# Patient Record
Sex: Male | Born: 1950 | Race: White | Hispanic: No | Smoking: Never smoker
Health system: Southern US, Community
[De-identification: ages and names within clinical notes are randomized; demographics above are authoritative.]

## PROBLEM LIST (undated history)

## (undated) DIAGNOSIS — G629 Polyneuropathy, unspecified: Secondary | ICD-10-CM

## (undated) DIAGNOSIS — I10 Essential (primary) hypertension: Secondary | ICD-10-CM

## (undated) DIAGNOSIS — R7303 Prediabetes: Secondary | ICD-10-CM

## (undated) DIAGNOSIS — I639 Cerebral infarction, unspecified: Secondary | ICD-10-CM

## (undated) DIAGNOSIS — Z8739 Personal history of other diseases of the musculoskeletal system and connective tissue: Secondary | ICD-10-CM

## (undated) DIAGNOSIS — M1990 Unspecified osteoarthritis, unspecified site: Secondary | ICD-10-CM

## (undated) DIAGNOSIS — N189 Chronic kidney disease, unspecified: Secondary | ICD-10-CM

## (undated) DIAGNOSIS — Z87442 Personal history of urinary calculi: Secondary | ICD-10-CM

## (undated) HISTORY — PX: HEMORRHOID SURGERY: SHX153

## (undated) HISTORY — PX: CARPAL TUNNEL RELEASE: SHX101

## (undated) HISTORY — PX: LITHOTRIPSY: SUR834

## (undated) HISTORY — PX: HERNIA REPAIR: SHX51

## (undated) HISTORY — PX: BACK SURGERY: SHX140

---

## 2015-10-10 DIAGNOSIS — I639 Cerebral infarction, unspecified: Secondary | ICD-10-CM

## 2015-10-10 HISTORY — DX: Cerebral infarction, unspecified: I63.9

## 2017-11-14 ENCOUNTER — Other Ambulatory Visit: Payer: Self-pay | Admitting: Podiatry

## 2017-11-19 NOTE — Patient Instructions (Signed)
James Gillespie  11/19/2017     @PREFPERIOPPHARMACY @   Your procedure is scheduled on  11/21/2017   Report to [facility] at  645   A.M.  Call this number if you have problems the morning of surgery:  [phone]   Remember:  Do not eat food or drink liquids after midnight.  Take these medicines the morning of surgery with A SIP OF WATER  Neurontin, lisinopril.   Do not wear jewelry, make-up or nail polish.  Do not wear lotions, powders, or perfumes, or deodorant.  Do not shave 48 hours prior to surgery.  Men may shave face and neck.  Do not bring valuables to the hospital.  [facility] is not responsible for any belongings or valuables.  Contacts, dentures or bridgework may not be worn into surgery.  Leave your suitcase in the car.  After surgery it may be brought to your room.  For patients admitted to the hospital, discharge time will be determined by your treatment team.  Patients discharged the day of surgery will not be allowed to drive home.   Name and phone number of your driver:   family Special instructions:  None  Please read over the following fact sheets that you were given. Anesthesia Post-op Instructions and Care and Recovery After Surgery      Surgical Wound Debridement Surgical wound debridement is a procedure that removes dead or infected tissue and other substances from a wound. To heal, a wound must be clean. It also must have an adequate blood supply. Anything that prevents this must be taken out of the wound. This may include:  Dead tissue.  Scar tissue.  Fluid that has built up.  Debris from outside of the body.  You may need this procedure if you have a wound that has not healed (chronic wound). Tell a health care provider about:  Any allergies you have.  All medicines you are taking, including vitamins, herbs, eyedrops, creams, and over-the-counter medicines.  Any problems you or family members have had with  anesthetic medicines.  Any blood disorders you have.  Any surgeries you have had.  Any medical conditions you have.  Whether you are pregnant or may be pregnant. What are the risks? Generally, this is a safe procedure. However, problems may occur, including:  Bleeding.  Infection.  Damage to nerves, blood vessels, or healthy tissue inside the wound.  Scarring and loss of function.  What happens before the procedure?  Ask your health care provider about: ? Changing or stopping your regular medicines. This is especially important if you are taking diabetes medicines or blood thinners. ? Taking medicines such as aspirin and ibuprofen. These medicines can thin your blood. Do not take these medicines before your procedure if your health care provider instructs you not to.  Follow instructions from your health care provider about eating or drinking restrictions.  You may be given antibiotic medicine to help prevent or treat infection.  You may have blood tests.  Plan to have someone take you home after the procedure. What happens during the procedure?  To reduce your risk of infection: ? Your health care team will wash or sanitize their hands. ? Your skin will be washed with soap.  You may have small monitors placed on your body. These are used to check your heart, blood pressure, and oxygen level.  An IV will be put in your hand or  arm.  You will be given one or more of the following: ? A medicine to help you relax (sedative). ? A medicine to numb the area (local anesthetic). ? A medicine to make you fall asleep (general anesthetic). ? A medicine that is injected into your spine to numb the area below and slightly above the injection site (spinal anesthetic). ? A medicine that is injected into an area of your body to numb everything below the injection site (regional anesthetic).  Your health care provider will clean your wound with a sterile salt-water (saline)  solution.  Your health care provider will use scissors, surgical knives (scalpels) and surgical tweezers (forceps), or a laser to remove dead tissue. Your health care provider will also remove any other material that should not be in the wound.  After the tissue and other materials have been removed from the wound, your health care provider will clean the wound again and apply a bandage (dressing). The procedure may vary among health care providers and hospitals. What happens after the procedure?  Your blood pressure, heart rate, breathing rate, and blood oxygen level will be monitored often until the medicines you were given have worn off.  You will be given medicine for pain.  You will continue to receive antibiotic medicine if it was started before your procedure. This information is not intended to replace advice given to you by your health care provider. Make sure you discuss any questions you have with your health care provider. Document Released: 12/20/2009 Document Revised: 03/02/2016 Document Reviewed: 02/03/2015 Elsevier Interactive Patient Education  2018 [place].  Surgical Wound Debridement, Care After Refer to this sheet in the next few weeks. These instructions provide you with information about caring for yourself after your procedure. Your health care provider may also give you more specific instructions. Your treatment has been planned according to current medical practices, but problems sometimes occur. Call your health care provider if you have any problems or questions after your procedure. What can I expect after the procedure? After the procedure, it is common to have:  Pain or soreness.  Fluid that leaks from the wound.  Stiffness.  Follow these instructions at home: Medicines  Take over-the-counter and prescription medicines only as told by your health care provider.  If you were prescribed an antibiotic medicine, take it or apply it as told by your  health care provider. Do not stop taking or using the antibiotic even if your condition improves. Wound care  Follow instructions from your health care provider about: ? How to take care of your wound. ? When and how you should change your dressing. ? When you should remove your dressing. If your dressing is dry and stuck when you try to remove it, moisten or wet the dressing with saline or water so that it can be removed without harming your skin or wound tissue.  Check your wound every day for signs of infection. Have a caregiver do this for you if you are not able. Watch for: ? More redness, swelling, or pain. ? More fluid, blood, or pus. ? A bad smell. Activity   Do not drive or operate heavy machinery while taking prescription pain medicine.  Ask your health care provider what activities are safe for you. General instructions  Eat a healthy diet with lots of protein. Ask your health care provider to suggest the best diet for you.  Do not smoke. Smoking makes it harder for your body to heal.  Keep all  follow-up visits as told by your health care provider. This is important.  Do not take baths, swim, or use a hot tub until your health care provider approves. Contact a health care provider if:  You have a fever.  Your pain medicine is not helping.  Your wound is red and swollen.  You have increased bleeding.  You have pus coming from your wound.  You have a bad smell coming from your wound.  Your wound is not getting better after 1-2 weeks of treatment.  You develop a new medical condition, such as diabetes, peripheral vascular disease, or conditions that affect your defense (immune) system. This information is not intended to replace advice given to you by your health care provider. Make sure you discuss any questions you have with your health care provider. Document Released: 09/11/2012 Document Revised: 03/01/2016 Document Reviewed: 02/03/2015 Elsevier Interactive  Patient Education  2018 [place].  Metatarsal Osteotomy Metatarsal osteotomy is a surgical procedure to correct a toe dislocation or deformity. The surgery may also help to relieve foot pain. Your metatarsals are the five long bones that connect your toes to the rest of your foot. Osteotomy is a surgical cut into a bone to reshape and reposition the bone or joint. Tell a health care provider about:  Any allergies you have.  All medicines you are taking, including vitamins, herbs, eye drops, creams, and over-the-counter medicines.  Any problems you or family members have had with anesthetic medicines.  Any blood disorders you have.  Any surgeries you have had.  Any medical conditions you have. What are the risks? Generally, this is a safe procedure. However, problems may occur, including:  Stiffness.  Pain.  Infection.  Bleeding.  Allergic reactions to medicines.  Nerve damage that causes numbness.  Failure of the osteotomy to heal.  A blood clot that forms in your leg and travels to your lungs (pulmonary embolism).  What happens before the procedure?  Your health care provider may order imaging tests of your foot.  Follow instructions from your health care provider about eating or drinking restrictions.  Ask your health care provider about: ? Changing or stopping your regular medicines. This is especially important if you are taking diabetes medicines or blood thinners. ? Taking medicines such as aspirin and ibuprofen. These medicines can thin your blood. Do not take these medicines before your procedure if your health care provider instructs you not to.  Plan to have someone take you home after the procedure.  Ask your health care provider how your surgical site will be marked or identified.  You may be given antibiotic medicine to help prevent infection. What happens during the procedure?  To reduce your risk of infection: ? Your health care team will  wash or sanitize their hands. ? Your skin will be washed with soap. ? Hair may be removed from the surgical area.  An IV tube will be started in one of your veins.  You will be given one or more of the following: ? A medicine to help you relax (sedative). ? A medicine to numb the area (local anesthetic). ? A medicine to make you fall asleep (general anesthetic). ? A medicine that is injected into your spine to numb the area below and slightly above the injection site (spinal anesthetic). ? A medicine that is injected into an area of your body to numb everything below the injection site (regional anesthetic).  A surgical cut (incision) will be made on your foot over  the metatarsal bone that will be treated.  A cut will be made in the bone to shorten or straighten the bone.  Metal pins, plates, or screws may be used to hold the bone in the right position.  The incision will be closed with stitches (sutures) or staples.  A bandage (dressing) will be placed around the front and bottom of your foot. The procedure may vary among health care providers and hospitals. What happens after the procedure?  Your blood pressure, heart rate, breathing rate, and blood oxygen level will be monitored often until the medicines you were given have worn off.  You may be given walking aids, such as: ? A custom-fitted hard-soled shoe that keeps weight on your heel. ? A walking boot. ? A splint. ? Crutches or a walker to help you walk without putting weight on your foot.  Do not drive for 24 hours if you received a sedative. This information is not intended to replace advice given to you by your health care provider. Make sure you discuss any questions you have with your health care provider. Document Released: 09/06/2015 Document Revised: 03/02/2016 Document Reviewed: 05/20/2015 Elsevier Interactive Patient Education  2018 [place].  Metatarsal Osteotomy, Care After Refer to this sheet in the  next few weeks. These instructions provide you with information about caring for yourself after your procedure. Your health care provider may also give you more specific instructions. Your treatment has been planned according to current medical practices, but problems sometimes occur. Call your health care provider if you have any problems or questions after your procedure. What can I expect after the procedure? After the procedure, it is common to have:  Soreness.  Pain.  Stiffness.  Swelling.  Follow these instructions at home: If you have a splint:  Wear the splint as told by your health care provider. Remove it only as told by your health care provider.  Loosen the splint if your toes tingle, become numb, or turn cold and blue.  Do not let your splint get wet if it is not waterproof.  Keep the splint clean. Bathing  Do not take baths, swim, or use a hot tub until your health care provider approves. Ask your health care provider if you can take showers. You may only be allowed to take sponge baths for bathing.  If your splint is not waterproof, cover it with a watertight plastic bag when you take a bath or a shower.  Keep the bandage (dressing) dry until your health care provider says it can be removed. Incision care  Follow instructions from your health care provider about how to take care of your cut from surgery (incision). Make sure you: ? Wash your hands with soap and water before you change your bandage (dressing). If soap and water are not available, use hand sanitizer. ? Change your dressing as told by your health care provider. ? Leave stitches (sutures), skin glue, or adhesive strips in place. These skin closures may need to stay in place for 2 weeks or longer. If adhesive strip edges start to loosen and curl up, you may trim the loose edges. Do not remove adhesive strips completely unless your health care provider tells you to do that.  Check your incision area every  day for signs of infection. Check for: ? More redness, swelling, or pain. ? More fluid or blood. ? Warmth. ? Pus or a bad smell. Managing pain, stiffness, and swelling   If directed, apply ice to the  injured area. ? Put ice in a plastic bag. ? Place a towel between your skin and the bag. ? Leave the ice on for 20 minutes, 2-3 times a day.  Move your toes often to avoid stiffness and to lessen swelling.  Raise (elevate) the injured area above the level of your heart while you are sitting or lying down. Driving  Do not drive or operate heavy machinery while taking prescription pain medicine.  Do not drive for 24 hours if you received a sedative.  Ask your health care provider when it is safe to drive if you have a dressing, splint, special shoe, or walking boot on your foot. General instructions  If you were given a splint, special shoe, or walking boot, wear it as told by your health care provider.  Return to your normal activities as told by your health care provider. Ask your health care provider what activities are safe for you.  Do not use the injured limb to support your body weight until your health care provider says that you can. Use crutches or a walker as told by your health care provider.  Do not use any tobacco products, such as cigarettes, chewing tobacco, and e-cigarettes. Tobacco can delay bone healing. If you need help quitting, ask your health care provider.  Take over-the-counter and prescription medicines only as told by your health care provider.  Keep all follow-up visits as told by your health care provider. This is important. Contact a health care provider if:  You have a fever.  Your dressing becomes wet, loose, or stained with blood or discharge.  You have pus or a bad smell coming from your incision or bandage.  Your foot becomes red, swollen, or tender.  You have pain or stiffness that does not get better or gets worse.  You have tingling or  numbness in your foot that does not get better or gets worse. Get help right away if:  You develop a warm and tender swelling in your leg.  You have chest pain.  You have trouble breathing. This information is not intended to replace advice given to you by your health care provider. Make sure you discuss any questions you have with your health care provider. Document Released: 09/06/2015 Document Revised: 03/02/2016 Document Reviewed: 05/20/2015 Elsevier Interactive Patient Education  2018 [place] Anesthesia is a term that refers to techniques, procedures, and medicines that help a person stay safe and comfortable during a medical procedure. Monitored anesthesia care, or sedation, is one type of anesthesia. Your anesthesia specialist may recommend sedation if you will be having a procedure that does not require you to be unconscious, such as:  Cataract surgery.  A dental procedure.  A biopsy.  A colonoscopy.  During the procedure, you may receive a medicine to help you relax (sedative). There are three levels of sedation:  Mild sedation. At this level, you may feel awake and relaxed. You will be able to follow directions.  Moderate sedation. At this level, you will be sleepy. You may not remember the procedure.  Deep sedation. At this level, you will be asleep. You will not remember the procedure.  The more medicine you are given, the deeper your level of sedation will be. Depending on how you respond to the procedure, the anesthesia specialist may change your level of sedation or the type of anesthesia to fit your needs. An anesthesia specialist will monitor you closely during the procedure. Let your health care  provider know about:  Any allergies you have.  All medicines you are taking, including vitamins, herbs, eye drops, creams, and over-the-counter medicines.  Any use of steroids (by mouth or as a cream).  Any problems you or family  members have had with sedatives and anesthetic medicines.  Any blood disorders you have.  Any surgeries you have had.  Any medical conditions you have, such as sleep apnea.  Whether you are pregnant or may be pregnant.  Any use of cigarettes, alcohol, or street drugs. What are the risks? Generally, this is a safe procedure. However, problems may occur, including:  Getting too much medicine (oversedation).  Nausea.  Allergic reaction to medicines.  Trouble breathing. If this happens, a breathing tube may be used to help with breathing. It will be removed when you are awake and breathing on your own.  Heart trouble.  Lung trouble.  Before the procedure Staying hydrated Follow instructions from your health care provider about hydration, which may include:  Up to 2 hours before the procedure - you may continue to drink clear liquids, such as water, clear fruit juice, black coffee, and plain tea.  Eating and drinking restrictions Follow instructions from your health care provider about eating and drinking, which may include:  8 hours before the procedure - stop eating heavy meals or foods such as meat, fried foods, or fatty foods.  6 hours before the procedure - stop eating light meals or foods, such as toast or cereal.  6 hours before the procedure - stop drinking milk or drinks that contain milk.  2 hours before the procedure - stop drinking clear liquids.  Medicines Ask your health care provider about:  Changing or stopping your regular medicines. This is especially important if you are taking diabetes medicines or blood thinners.  Taking medicines such as aspirin and ibuprofen. These medicines can thin your blood. Do not take these medicines before your procedure if your health care provider instructs you not to.  Tests and exams  You will have a physical exam.  You may have blood tests done to show: ? How well your kidneys and liver are working. ? How well your  blood can clot.  General instructions  Plan to have someone take you home from the hospital or clinic.  If you will be going home right after the procedure, plan to have someone with you for 24 hours.  What happens during the procedure?  Your blood pressure, heart rate, breathing, level of pain and overall condition will be monitored.  An IV tube will be inserted into one of your veins.  Your anesthesia specialist will give you medicines as needed to keep you comfortable during the procedure. This may mean changing the level of sedation.  The procedure will be performed. After the procedure  Your blood pressure, heart rate, breathing rate, and blood oxygen level will be monitored until the medicines you were given have worn off.  Do not drive for 24 hours if you received a sedative.  You may: ? Feel sleepy, clumsy, or nauseous. ? Feel forgetful about what happened after the procedure. ? Have a sore throat if you had a breathing tube during the procedure. ? Vomit. This information is not intended to replace advice given to you by your health care provider. Make sure you discuss any questions you have with your health care provider. Document Released: 06/21/2005 Document Revised: 03/03/2016 Document Reviewed: 01/16/2016 Elsevier Interactive Patient Education  2018 [place],  Care After These instructions provide you with information about caring for yourself after your procedure. Your health care provider may also give you more specific instructions. Your treatment has been planned according to current medical practices, but problems sometimes occur. Call your health care provider if you have any problems or questions after your procedure. What can I expect after the procedure? After your procedure, it is common to:  Feel sleepy for several hours.  Feel clumsy and have poor balance for several hours.  Feel forgetful about what happened after the  procedure.  Have poor judgment for several hours.  Feel nauseous or vomit.  Have a sore throat if you had a breathing tube during the procedure.  Follow these instructions at home: For at least 24 hours after the procedure:   Do not: ? Participate in activities in which you could fall or become injured. ? Drive. ? Use heavy machinery. ? Drink alcohol. ? Take sleeping pills or medicines that cause drowsiness. ? Make important decisions or sign legal documents. ? Take care of children on your own.  Rest. Eating and drinking  Follow the diet that is recommended by your health care provider.  If you vomit, drink water, juice, or soup when you can drink without vomiting.  Make sure you have little or no nausea before eating solid foods. General instructions  Have a responsible adult stay with you until you are awake and alert.  Take over-the-counter and prescription medicines only as told by your health care provider.  If you smoke, do not smoke without supervision.  Keep all follow-up visits as told by your health care provider. This is important. Contact a health care provider if:  You keep feeling nauseous or you keep vomiting.  You feel light-headed.  You develop a rash.  You have a fever. Get help right away if:  You have trouble breathing. This information is not intended to replace advice given to you by your health care provider. Make sure you discuss any questions you have with your health care provider. Document Released: 01/16/2016 Document Revised: 05/17/2016 Document Reviewed: 01/16/2016 Elsevier Interactive Patient Education  [employer].

## 2017-11-20 ENCOUNTER — Encounter (HOSPITAL_COMMUNITY)
Admission: RE | Admit: 2017-11-20 | Discharge: 2017-11-20 | Disposition: A | Discharge status: Home / Self Care | Payer: Medicare Other | Source: Ambulatory Visit | Attending: Podiatry | Admitting: Podiatry

## 2017-11-20 ENCOUNTER — Other Ambulatory Visit: Payer: Self-pay

## 2017-11-20 ENCOUNTER — Ambulatory Visit (HOSPITAL_COMMUNITY)
Admission: RE | Admit: 2017-11-20 | Discharge: 2017-11-20 | Disposition: A | Discharge status: Home / Self Care | Payer: Medicare Other | Source: Ambulatory Visit | Attending: Podiatry | Admitting: Podiatry

## 2017-11-20 ENCOUNTER — Encounter (HOSPITAL_COMMUNITY): Payer: Self-pay

## 2017-11-20 DIAGNOSIS — Z01810 Encounter for preprocedural cardiovascular examination: Secondary | ICD-10-CM | POA: Insufficient documentation

## 2017-11-20 DIAGNOSIS — Z01818 Encounter for other preprocedural examination: Secondary | ICD-10-CM | POA: Diagnosis present

## 2017-11-20 DIAGNOSIS — Z01812 Encounter for preprocedural laboratory examination: Secondary | ICD-10-CM | POA: Insufficient documentation

## 2017-11-20 DIAGNOSIS — L97512 Non-pressure chronic ulcer of other part of right foot with fat layer exposed: Secondary | ICD-10-CM | POA: Diagnosis not present

## 2017-11-20 HISTORY — DX: Personal history of other diseases of the musculoskeletal system and connective tissue: Z87.39

## 2017-11-20 HISTORY — DX: Cerebral infarction, unspecified: I63.9

## 2017-11-20 HISTORY — DX: Polyneuropathy, unspecified: G62.9

## 2017-11-20 HISTORY — DX: Essential (primary) hypertension: I10

## 2017-11-20 HISTORY — DX: Prediabetes: R73.03

## 2017-11-20 HISTORY — DX: Personal history of urinary calculi: Z87.442

## 2017-11-20 HISTORY — DX: Unspecified osteoarthritis, unspecified site: M19.90

## 2017-11-20 LAB — CBC WITH DIFFERENTIAL/PLATELET
Basophils Absolute: 0 10*3/uL (ref 0.0–0.1)
Basophils Relative: 0 %
EOS ABS: 0.3 10*3/uL (ref 0.0–0.7)
Eosinophils Relative: 3 %
HCT: 45.5 % (ref 39.0–52.0)
HEMOGLOBIN: 14.4 g/dL (ref 13.0–17.0)
LYMPHS ABS: 2.5 10*3/uL (ref 0.7–4.0)
Lymphocytes Relative: 22 %
MCH: 30.1 pg (ref 26.0–34.0)
MCHC: 31.6 g/dL (ref 30.0–36.0)
MCV: 95 fL (ref 78.0–100.0)
MONOS PCT: 13 %
Monocytes Absolute: 1.4 10*3/uL — ABNORMAL HIGH (ref 0.1–1.0)
NEUTROS PCT: 62 %
Neutro Abs: 7.1 10*3/uL (ref 1.7–7.7)
PLATELETS: 291 10*3/uL (ref 150–400)
RBC: 4.79 MIL/uL (ref 4.22–5.81)
RDW: 14.7 % (ref 11.5–15.5)
WBC: 11.4 10*3/uL — AB (ref 4.0–10.5)

## 2017-11-20 LAB — BASIC METABOLIC PANEL
Anion gap: 10 (ref 5–15)
BUN: 17 mg/dL (ref 6–20)
CALCIUM: 9 mg/dL (ref 8.9–10.3)
CO2: 25 mmol/L (ref 22–32)
CREATININE: 1.16 mg/dL (ref 0.61–1.24)
Chloride: 102 mmol/L (ref 101–111)
GFR calc Af Amer: >60 mL/min (ref >60)
Glucose, Bld: 83 mg/dL (ref 65–99)
Potassium: 4.2 mmol/L (ref 3.5–5.1)
SODIUM: 137 mmol/L (ref 135–145)

## 2017-11-20 LAB — HEMOGLOBIN A1C
HEMOGLOBIN A1C: 6.1 % — AB (ref 4.8–5.6)
MEAN PLASMA GLUCOSE: 128.37 mg/dL

## 2017-11-20 LAB — GLUCOSE, CAPILLARY: Glucose-Capillary: 84 mg/dL (ref 65–99)

## 2017-11-21 ENCOUNTER — Ambulatory Visit (HOSPITAL_COMMUNITY)
Admission: RE | Admit: 2017-11-21 | Discharge: 2017-11-21 | Disposition: A | Discharge status: Home / Self Care | Payer: Medicare Other | Source: Ambulatory Visit | Attending: Podiatry | Admitting: Podiatry

## 2017-11-21 ENCOUNTER — Ambulatory Visit (HOSPITAL_COMMUNITY): Payer: Medicare Other | Admitting: Anesthesiology

## 2017-11-21 ENCOUNTER — Encounter (HOSPITAL_COMMUNITY): Payer: Self-pay | Admitting: *Deleted

## 2017-11-21 ENCOUNTER — Ambulatory Visit (HOSPITAL_COMMUNITY): Payer: Medicare Other

## 2017-11-21 ENCOUNTER — Encounter (HOSPITAL_COMMUNITY)
Admission: RE | Disposition: A | Discharge status: Home / Self Care | Payer: Self-pay | Source: Ambulatory Visit | Attending: Podiatry

## 2017-11-21 DIAGNOSIS — Z79899 Other long term (current) drug therapy: Secondary | ICD-10-CM | POA: Diagnosis not present

## 2017-11-21 DIAGNOSIS — G629 Polyneuropathy, unspecified: Secondary | ICD-10-CM | POA: Insufficient documentation

## 2017-11-21 DIAGNOSIS — L97511 Non-pressure chronic ulcer of other part of right foot limited to breakdown of skin: Secondary | ICD-10-CM | POA: Insufficient documentation

## 2017-11-21 DIAGNOSIS — Z7982 Long term (current) use of aspirin: Secondary | ICD-10-CM | POA: Insufficient documentation

## 2017-11-21 DIAGNOSIS — Z9889 Other specified postprocedural states: Secondary | ICD-10-CM

## 2017-11-21 DIAGNOSIS — Z8673 Personal history of transient ischemic attack (TIA), and cerebral infarction without residual deficits: Secondary | ICD-10-CM | POA: Insufficient documentation

## 2017-11-21 DIAGNOSIS — E785 Hyperlipidemia, unspecified: Secondary | ICD-10-CM | POA: Diagnosis not present

## 2017-11-21 DIAGNOSIS — I10 Essential (primary) hypertension: Secondary | ICD-10-CM | POA: Diagnosis not present

## 2017-11-21 HISTORY — PX: WOUND DEBRIDEMENT: SHX247

## 2017-11-21 HISTORY — PX: WEIL OSTEOTOMY: SHX5044

## 2017-11-21 LAB — GLUCOSE, CAPILLARY: Glucose-Capillary: 95 mg/dL (ref 65–99)

## 2017-11-21 SURGERY — OSTEOTOMY, WEIL
Anesthesia: Monitor Anesthesia Care | Laterality: Right

## 2017-11-21 MED ORDER — SODIUM CHLORIDE 0.9 % IR SOLN
Status: DC | PRN
  Administered 2017-11-21: 1000 mL

## 2017-11-21 MED ORDER — CEFAZOLIN SODIUM-DEXTROSE 2-4 GM/100ML-% IV SOLN
2.0000 g | INTRAVENOUS | Status: AC
  Administered 2017-11-21: 2 g via INTRAVENOUS
  Filled 2017-11-21: qty 100

## 2017-11-21 MED ORDER — MIDAZOLAM HCL 2 MG/2ML IJ SOLN
1.0000 mg | INTRAMUSCULAR | Status: AC
  Administered 2017-11-21: 2 mg via INTRAVENOUS

## 2017-11-21 MED ORDER — PROPOFOL 10 MG/ML IV BOLUS
INTRAVENOUS | Status: AC
  Filled 2017-11-21: qty 40

## 2017-11-21 MED ORDER — CHLORHEXIDINE GLUCONATE CLOTH 2 % EX PADS: 6.0000 | MEDICATED_PAD | Freq: Once | CUTANEOUS | Status: DC

## 2017-11-21 MED ORDER — MIDAZOLAM HCL 2 MG/2ML IJ SOLN
INTRAMUSCULAR | Status: AC
  Filled 2017-11-21: qty 2

## 2017-11-21 MED ORDER — FENTANYL CITRATE (PF) 100 MCG/2ML IJ SOLN
INTRAMUSCULAR | Status: DC | PRN
  Administered 2017-11-21 (×2): 25 ug via INTRAVENOUS

## 2017-11-21 MED ORDER — BUPIVACAINE HCL (PF) 0.5 % IJ SOLN
INTRAMUSCULAR | Status: DC | PRN
  Administered 2017-11-21 (×2): 10 mL

## 2017-11-21 MED ORDER — FENTANYL CITRATE (PF) 100 MCG/2ML IJ SOLN
INTRAMUSCULAR | Status: AC
  Filled 2017-11-21: qty 2

## 2017-11-21 MED ORDER — SIMETHICONE 40 MG/0.6ML PO SUSP
ORAL | Status: AC
  Filled 2017-11-21: qty 1.2

## 2017-11-21 MED ORDER — FENTANYL CITRATE (PF) 100 MCG/2ML IJ SOLN
25.0000 ug | Freq: Once | INTRAMUSCULAR | Status: AC
  Administered 2017-11-21: 25 ug via INTRAVENOUS

## 2017-11-21 MED ORDER — BUPIVACAINE HCL (PF) 0.5 % IJ SOLN
INTRAMUSCULAR | Status: AC
  Filled 2017-11-21: qty 60

## 2017-11-21 MED ORDER — PROPOFOL 500 MG/50ML IV EMUL
INTRAVENOUS | Status: DC | PRN
  Administered 2017-11-21: 09:00:00 via INTRAVENOUS
  Administered 2017-11-21: 100 ug/kg/min via INTRAVENOUS

## 2017-11-21 MED ORDER — FENTANYL CITRATE (PF) 100 MCG/2ML IJ SOLN: 25.0000 ug | INTRAMUSCULAR | Status: DC | PRN

## 2017-11-21 MED ORDER — ONDANSETRON HCL 4 MG/2ML IJ SOLN
INTRAMUSCULAR | Status: AC
  Filled 2017-11-21: qty 2

## 2017-11-21 MED ORDER — LIDOCAINE HCL 1 % IJ SOLN
INTRAMUSCULAR | Status: DC | PRN
  Administered 2017-11-21: 10 mg via INTRADERMAL

## 2017-11-21 MED ORDER — LIDOCAINE HCL (PF) 1 % IJ SOLN
INTRAMUSCULAR | Status: AC
  Filled 2017-11-21: qty 5

## 2017-11-21 MED ORDER — LACTATED RINGERS IV SOLN
INTRAVENOUS | Status: DC
  Administered 2017-11-21: 1000 mL via INTRAVENOUS

## 2017-11-21 MED ORDER — PROPOFOL 10 MG/ML IV BOLUS
INTRAVENOUS | Status: DC | PRN
  Administered 2017-11-21 (×2): 10 mg via INTRAVENOUS

## 2017-11-21 MED ORDER — LIDOCAINE VISCOUS 2 % MT SOLN
OROMUCOSAL | Status: AC
  Filled 2017-11-21: qty 15

## 2017-11-21 SURGICAL SUPPLY — 57 items
114650 ×3 IMPLANT
BAG HAMPER (MISCELLANEOUS) ×3 IMPLANT
BANDAGE ELASTIC 4 LF NS (GAUZE/BANDAGES/DRESSINGS) ×3 IMPLANT
BANDAGE ESMARK 4X12 BL STRL LF (DISPOSABLE) ×1 IMPLANT
BENZOIN TINCTURE PRP APPL 2/3 (GAUZE/BANDAGES/DRESSINGS) ×3 IMPLANT
BIT DRILL 2.0 HCS 150 (BIT) IMPLANT
BLADE AVERAGE 25MMX9MM (BLADE) ×1
BLADE AVERAGE 25X9 (BLADE) ×2 IMPLANT
BLADE OSC/SAGITTAL MD 5.5X18 (BLADE) ×3 IMPLANT
BLADE SURG 15 STRL LF DISP TIS (BLADE) ×2 IMPLANT
BLADE SURG 15 STRL SS (BLADE) ×4
BNDG CONFORM 2 STRL LF (GAUZE/BANDAGES/DRESSINGS) ×3 IMPLANT
BNDG ESMARK 4X12 BLUE STRL LF (DISPOSABLE) ×3
BNDG GAUZE ELAST 4 BULKY (GAUZE/BANDAGES/DRESSINGS) ×3 IMPLANT
BOOT STEPPER DURA LG (SOFTGOODS) ×3 IMPLANT
BOOT STEPPER DURA MED (SOFTGOODS) IMPLANT
BOOT STEPPER DURA SM (SOFTGOODS) IMPLANT
BOOT STEPPER DURA XLG (SOFTGOODS) IMPLANT
CHLORAPREP W/TINT 26ML (MISCELLANEOUS) ×3 IMPLANT
CLOSURE WOUND 1/2 X4 (GAUZE/BANDAGES/DRESSINGS) ×2
CLOTH BEACON ORANGE TIMEOUT ST (SAFETY) ×3 IMPLANT
COVER LIGHT HANDLE STERIS (MISCELLANEOUS) ×6 IMPLANT
CUFF TOURNIQUET SINGLE 18IN (TOURNIQUET CUFF) ×3 IMPLANT
DECANTER SPIKE VIAL GLASS SM (MISCELLANEOUS) ×3 IMPLANT
DRAPE OEC MINIVIEW 54X84 (DRAPES) ×3 IMPLANT
DRSG ADAPTIC 3X8 NADH LF (GAUZE/BANDAGES/DRESSINGS) ×3 IMPLANT
ELECT REM PT RETURN 9FT ADLT (ELECTROSURGICAL) ×3
ELECTRODE REM PT RTRN 9FT ADLT (ELECTROSURGICAL) ×1 IMPLANT
GAUZE SPONGE 4X4 12PLY STRL (GAUZE/BANDAGES/DRESSINGS) ×3 IMPLANT
GLOVE BIO SURGEON STRL SZ7.5 (GLOVE) ×3 IMPLANT
GLOVE BIOGEL PI IND STRL 6.5 (GLOVE) ×1 IMPLANT
GLOVE BIOGEL PI IND STRL 7.0 (GLOVE) ×3 IMPLANT
GLOVE BIOGEL PI INDICATOR 6.5 (GLOVE) ×2
GLOVE BIOGEL PI INDICATOR 7.0 (GLOVE) ×6
GLOVE ECLIPSE 6.5 STRL STRAW (GLOVE) ×3 IMPLANT
GLOVE ECLIPSE 7.0 STRL STRAW (GLOVE) ×3 IMPLANT
GOWN STRL REUS W/ TWL XL LVL3 (GOWN DISPOSABLE) ×1 IMPLANT
GOWN STRL REUS W/TWL LRG LVL3 (GOWN DISPOSABLE) ×9 IMPLANT
GOWN STRL REUS W/TWL XL LVL3 (GOWN DISPOSABLE) ×2
KIT ROOM TURNOVER AP CYSTO (KITS) ×3 IMPLANT
MANIFOLD NEPTUNE II (INSTRUMENTS) ×3 IMPLANT
NEEDLE HYPO 27GX1-1/4 (NEEDLE) ×9 IMPLANT
NS IRRIG 1000ML POUR BTL (IV SOLUTION) ×3 IMPLANT
PACK BASIC LIMB (CUSTOM PROCEDURE TRAY) ×3 IMPLANT
PAD ARMBOARD 7.5X6 YLW CONV (MISCELLANEOUS) ×3 IMPLANT
RASP SM TEAR CROSS CUT (RASP) IMPLANT
SCREW HEADLESS COMPRESS 10X2.4 (Screw) ×3 IMPLANT
SET BASIN LINEN APH (SET/KITS/TRAYS/PACK) ×3 IMPLANT
SPONGE LAP 4X18 X RAY DECT (DISPOSABLE) ×6 IMPLANT
STRIP CLOSURE SKIN 1/2X4 (GAUZE/BANDAGES/DRESSINGS) ×4 IMPLANT
SUT PROLENE 4 0 PS 2 18 (SUTURE) ×3 IMPLANT
SUT VIC AB 2-0 CT2 27 (SUTURE) ×3 IMPLANT
SUT VIC AB 4-0 PS2 27 (SUTURE) IMPLANT
SUT VICRYL AB 3-0 FS1 BRD 27IN (SUTURE) ×3 IMPLANT
SYR CONTROL 10ML LL (SYRINGE) ×6 IMPLANT
TAPE HYPAFIX 6 X30' (GAUZE/BANDAGES/DRESSINGS) ×1
TAPE HYPAFIX 6X30 (GAUZE/BANDAGES/DRESSINGS) ×2 IMPLANT

## 2017-11-21 NOTE — Transfer of Care (Signed)
Immediate Anesthesia Transfer of Care Note  Patient: James Gillespie  Procedure(s) Performed: WEIL OSTEOTOMY RIGHT 4TH METATARSAL (Right ) DEBRIDEMENT ULCERATION RIGHT FOOT (Right )  Patient Location: PACU  Anesthesia Type:MAC  Level of Consciousness: awake and alert   Airway & Oxygen Therapy: Patient Spontanous Breathing and Patient connected to nasal cannula oxygen  Post-op Assessment: Report given to RN and Post -op Vital signs reviewed and stable  Post vital signs: Reviewed and stable  Last Vitals:  Vitals:   11/21/17 0800 11/21/17 0815  BP: (!) 161/90 (!) 160/89  Resp: 18 11  Temp:    SpO2: 94% 97%    Last Pain:  Vitals:   11/21/17 0722  TempSrc: Oral      Patients Stated Pain Goal: 8 (88/64/84 7207)  Complications: No apparent anesthesia complications

## 2017-11-21 NOTE — Anesthesia Postprocedure Evaluation (Signed)
Anesthesia Post Note  Patient: James Gillespie  Procedure(s) Performed: WEIL OSTEOTOMY RIGHT 4TH METATARSAL (Right ) DEBRIDEMENT ULCERATION RIGHT FOOT (Right )  Patient location during evaluation: PACU Anesthesia Type: MAC Level of consciousness: awake and alert and oriented Pain management: pain level controlled Vital Signs Assessment: post-procedure vital signs reviewed and stable Respiratory status: spontaneous breathing Cardiovascular status: blood pressure returned to baseline and stable Postop Assessment: no apparent nausea or vomiting Anesthetic complications: no     Last Vitals:  Vitals:   11/21/17 1015 11/21/17 1030  BP: (!) 162/82 (!) 162/85  Pulse: 60 (!) 57  Resp: 15 12  Temp:    SpO2: 95% 98%    Last Pain:  Vitals:   11/21/17 0722  TempSrc: Oral                 Rie Mcneil

## 2017-11-21 NOTE — Discharge Instructions (Addendum)
These instructions will give you an idea of what to expect after surgery and how to manage issues that may arise before your first post op office visit. ° °Pain Management °Pain is best managed by “staying ahead” of it. If pain gets out of control, it is difficult to get it back under control. Local anesthesia that lasts 6-8 hours is used to numb the foot and decrease pain.  For the best pain control, take the pain medication every 4 hours for the first 2 days post op. On the third day pain medication can be taken as needed.  ° °Post Op Nausea °Nausea is common after surgery, so it is managed proactively.  °If prescribed, use the prescribed nausea medication regularly for the first 2 days post op. ° °Bandages °Do not worry if there is blood on the bandage. What looks like a lot of blood on the bandage is actually a small amount. Blood on the dressing spreads out as it is absorbed by the gauze, the same way a drop of water spreads out on a paper towel.  °If the bandages feel wet or dry, stiff and uncomfortable, call the office during office hours and we will schedule a time for you to have the bandage changed.  °Unless you are specifically told otherwise, we will do the first bandage change in the office.  °Keep your bandage dry. If the bandage becomes wet or soiled, notify the office and we will schedule a time to change the bandage. ° °Activity °It is best to spend most of the first 2 days after surgery lying down with the foot elevated above the level of your heart. °You may put weight on your heel while wearing the CAM Walker (black boot).   °You may only get up to go to the restroom. ° °Driving °Do not drive until you are able to respond in an emergency (i.e. slam on the brakes). This usually occurs after the bone has healed - 6 to 8 weeks. ° °Call the Office °If you have a fever over 101°F.  °If you have increasing pain after the initial post op pain has settled down.  °If you have increasing redness, swelling,  or drainage.  °If you have any questions or concerns.  ° ° °PATIENT INSTRUCTIONS °POST-ANESTHESIA ° °IMMEDIATELY FOLLOWING SURGERY:  Do not drive or operate machinery for the first twenty four hours after surgery.  Do not make any important decisions for twenty four hours after surgery or while taking narcotic pain medications or sedatives.  If you develop intractable nausea and vomiting or a severe headache please notify your doctor immediately. ° °FOLLOW-UP:  Please make an appointment with your surgeon as instructed. You do not need to follow up with anesthesia unless specifically instructed to do so. ° °WOUND CARE INSTRUCTIONS (if applicable):  Keep a dry clean dressing on the anesthesia/puncture wound site if there is drainage.  Once the wound has quit draining you may leave it open to air.  Generally you should leave the bandage intact for twenty four hours unless there is drainage.  If the epidural site drains for more than 36-48 hours please call the anesthesia department. ° °QUESTIONS?:  Please feel free to call your physician or the hospital operator if you have any questions, and they will be happy to assist you.    ° ° ° °

## 2017-11-21 NOTE — Anesthesia Preprocedure Evaluation (Addendum)
Anesthesia Evaluation  Patient identified by MRN, date of birth, ID band Patient awake    Reviewed: Allergy & Precautions, NPO status , Patient's Chart, lab work & pertinent test results  Airway Mallampati: II  TM Distance: >3 FB Neck ROM: Full    Dental  (+) Teeth Intact   Pulmonary neg pulmonary ROS,    breath sounds clear to auscultation       Cardiovascular hypertension, Pt. on medications  Rhythm:Regular Rate:Normal     Neuro/Psych CVA, No Residual Symptoms negative psych ROS   GI/Hepatic negative GI ROS, Neg liver ROS,   Endo/Other  diabetes (pre DM ?)  Renal/GU      Musculoskeletal   Abdominal   Peds  Hematology   Anesthesia Other Findings   Reproductive/Obstetrics                             Anesthesia Physical Anesthesia Plan  ASA: III  Anesthesia Plan: MAC   Post-op Pain Management:    Induction: Intravenous  PONV Risk Score and Plan:   Airway Management Planned: Simple Face Mask  Additional Equipment:   Intra-op Plan:   Post-operative Plan:   Informed Consent: I have reviewed the patients History and Physical, chart, labs and discussed the procedure including the risks, benefits and alternatives for the proposed anesthesia with the patient or authorized representative who has indicated his/her understanding and acceptance.     Plan Discussed with:   Anesthesia Plan Comments:        Anesthesia Quick Evaluation

## 2017-11-21 NOTE — H&P (Signed)
HISTORY AND PHYSICAL INTERVAL NOTE:  11/21/2017  8:13 AM  James Gillespie  has presented today for surgery, with the diagnosis of chronic ulceration right foot, peripheral neuropathy.  The various methods of treatment have been discussed with the patient.  No guarantees were given.  After consideration of risks, benefits and other options for treatment, the patient has consented to surgery.  I have reviewed the patients' chart and labs.    Patient Vitals for the past 24 hrs:  BP Temp Temp src Resp SpO2  11/21/17 0800 (!) 161/90 - - 18 94 %  11/21/17 0745 (!) 159/90 - - 15 95 %  11/21/17 0730 (!) 166/89 - - 14 96 %  11/21/17 0722 (!) 166/89 97.7 F (36.5 C) Oral 18 98 %    A history and physical examination was performed in my office.  The patient was reexamined.  There have been no changes to this history and physical examination.  Marcheta Grammes, DPM

## 2017-11-21 NOTE — Brief Op Note (Signed)
BRIEF OPERATIVE NOTE  DATE OF PROCEDURE 11/21/2017  SURGEON Marcheta Grammes, DPM  ASSISTANT SURGEON None  OR STAFF Circulator: Towanda Malkin, RN; Edrick Kins, RN Scrub Person: Ardeth Sportsman C   PREOPERATIVE DIAGNOSIS 1.  Chronic ulceration, right foot 2.  Peripheral neuropathy  POSTOPERATIVE DIAGNOSIS Same  PROCEDURE 1.  Weil osteotomy of the fourth metatarsal, right foot 2.  Debridement of ulceration, right foot  ANESTHESIA Monitor Anesthesia Care   HEMOSTASIS Pneumatic ankle tourniquet set at 250 mmHg  ESTIMATED BLOOD LOSS Minimal (<5 cc)  MATERIALS USED 2.4 Synthes screw  INJECTABLES 0.5% Marcaine plain  PATHOLOGY None  COMPLICATIONS None

## 2017-11-21 NOTE — Op Note (Signed)
OPERATIVE NOTE  BRIEF OPERATIVE NOTE  DATE OF PROCEDURE 11/21/2017  SURGEON Marcheta Grammes, DPM  ASSISTANT SURGEON None  OR STAFF Circulator: Towanda Malkin, RN; Edrick Kins, RN Scrub Person: Ardeth Sportsman C   PREOPERATIVE DIAGNOSIS 1.  Chronic ulceration, right foot 2.  Peripheral neuropathy  POSTOPERATIVE DIAGNOSIS Same  PROCEDURE 1.  Weil osteotomy of the fourth metatarsal, right foot 2.  Debridement of ulceration, right foot  ANESTHESIA Monitor Anesthesia Care   HEMOSTASIS Pneumatic ankle tourniquet set at 250 mmHg  ESTIMATED BLOOD LOSS Minimal (<5 cc)  MATERIALS USED 2.4 Synthes screw  INJECTABLES 0.5% Marcaine plain  PATHOLOGY None  COMPLICATIONS None   INDICATIONS:  Chronic ulceration of the right foot that failed to resolve with nonsurgical care.  DESCRIPTION OF THE PROCEDURE:  The patient was brought to the operating room and placed on the operative table in the supine position.  A pneumatic ankle tourniquet was applied to the operative extremity.  Following sedation, the surgical site was anesthetized with 0.5% Marcaine plain.  The foot was then prepped, scrubbed, and draped in the usual sterile technique.  The foot was elevated, exsanguinated and the pneumatic ankle tourniquet inflated to 250 mmHg.    Attention was directed to the dorsal aspect of his right foot.  A linear longitudinal incision was made overlying the fourth metatarsophalangeal joint.  The incision was made lateral to the extensor digitorum longus tendon to the fourth toe.  Dissection was continued deep down to the level of the metatarsal phalangeal joint.  A linear periosteal and capsular incision was performed.  The periosteal and capsular structures were reflected medial an laterally exposing the fourth metatarsal head.  Using a power bone saw, a Weil osteotomy was performed.  The capital fragment was translated proximally.  The osteotomy was fixated using an Integra 2.0  mm screw.  Adequate compression was not achieved.  The screw was removed and passed from the operative field.  A Synthes 2.4 mm screw was inserted across the osteotomy site.  Adequate compression was achieved.  The position of the screw was confirmed with fluoroscopy.  The surgical wound was irrigated with copious amounts of sterile irrigant.  The periosteal and capsular structures were reapproximated with 4-0 Vicryl.  The subcutaneous structures were reapproximated using 4-0 Vicryl.  The skin was reapproximated using 4-0 Vicryl and 4-0 Prolene.  The wound closure was reinforced with Steri-strips.    Attention was directed plantar aspect of the right fourth metatarsal head where a full-thickness ulceration was encountered measuring 0.4 x 0.6 by 0.2 cm.  Periwound was hyperkeratotic.  The wound bed was pink red with a focal area of fibrotic tissue centrally.  Using a #15 blade, a full-thickness excision always debridement was performed.  Adherent, nonviable fibrotic tissue including subcutaneous tissue was debrided and removed.  Post debridement, the ulceration measured 0.5 x 0.7 x 0.3 cm with a bleeding viable wound base and exposure of the subcutaneous layer.    A Betadine dressing was applied to the right foot.  The pneumatic ankle tourniquet was released and prompt hyperemic response was noted to all digits of the right foot.    The patient tolerated the procedure well.  The patient was then transferred to PACU with vital signs stable and vascular status intact to all toes of the operative foot.

## 2017-11-23 ENCOUNTER — Encounter (HOSPITAL_COMMUNITY): Payer: Self-pay | Admitting: Podiatry

## 2017-12-31 ENCOUNTER — Other Ambulatory Visit: Payer: Self-pay | Admitting: Podiatry

## 2018-01-11 ENCOUNTER — Encounter (HOSPITAL_COMMUNITY)
Admission: RE | Admit: 2018-01-11 | Discharge: 2018-01-11 | Disposition: A | Discharge status: Home / Self Care | Payer: Medicare Other | Source: Ambulatory Visit | Attending: Podiatry | Admitting: Podiatry

## 2018-01-11 ENCOUNTER — Encounter (HOSPITAL_COMMUNITY): Payer: Self-pay

## 2018-01-11 ENCOUNTER — Ambulatory Visit (HOSPITAL_COMMUNITY)
Admission: RE | Admit: 2018-01-11 | Discharge: 2018-01-11 | Disposition: A | Discharge status: Home / Self Care | Payer: Medicare Other | Source: Ambulatory Visit | Attending: Podiatry | Admitting: Podiatry

## 2018-01-11 DIAGNOSIS — M19072 Primary osteoarthritis, left ankle and foot: Secondary | ICD-10-CM | POA: Diagnosis not present

## 2018-01-11 DIAGNOSIS — L97522 Non-pressure chronic ulcer of other part of left foot with fat layer exposed: Secondary | ICD-10-CM | POA: Diagnosis not present

## 2018-01-11 DIAGNOSIS — M879 Osteonecrosis, unspecified: Secondary | ICD-10-CM | POA: Insufficient documentation

## 2018-01-11 NOTE — Patient Instructions (Signed)
    [name]  01/11/2018     @[email or web address] @   Your procedure is scheduled on  01/16/2018   Report to [facility] at  645   A.M.  Call this number if you have problems the morning of surgery:  [phone]   Remember:  Do not eat food or drink liquids after midnight.  Take these medicines the morning of surgery with A SIP OF WATER  Neurontin, lisinopril.   Do not wear jewelry, make-up or nail polish.  Do not wear lotions, powders, or perfumes, or deodorant.  Do not shave 48 hours prior to surgery.  Men may shave face and neck.  Do not bring valuables to the hospital.  [facility] is not responsible for any belongings or valuables.  Contacts, dentures or bridgework may not be worn into surgery.  Leave your suitcase in the car.  After surgery it may be brought to your room.  For patients admitted to the hospital, discharge time will be determined by your treatment team.  Patients discharged the day of surgery will not be allowed to drive home.   Name and phone number of your driver:   family Special instructions:  None  Please read over the following fact sheets that you were given. Anesthesia Post-op Instructions and Care and Recovery After Surgery

## 2018-01-16 ENCOUNTER — Ambulatory Visit (HOSPITAL_COMMUNITY): Payer: Medicare Other

## 2018-01-16 ENCOUNTER — Ambulatory Visit (HOSPITAL_COMMUNITY)
Admission: RE | Admit: 2018-01-16 | Discharge: 2018-01-16 | Disposition: A | Discharge status: Home / Self Care | Payer: Medicare Other | Source: Ambulatory Visit | Attending: Podiatry | Admitting: Podiatry

## 2018-01-16 ENCOUNTER — Ambulatory Visit (HOSPITAL_COMMUNITY): Payer: Medicare Other | Admitting: Anesthesiology

## 2018-01-16 ENCOUNTER — Encounter (HOSPITAL_COMMUNITY): Payer: Self-pay | Admitting: Emergency Medicine

## 2018-01-16 ENCOUNTER — Encounter (HOSPITAL_COMMUNITY)
Admission: RE | Disposition: A | Discharge status: Home / Self Care | Payer: Self-pay | Source: Ambulatory Visit | Attending: Podiatry

## 2018-01-16 DIAGNOSIS — L97528 Non-pressure chronic ulcer of other part of left foot with other specified severity: Secondary | ICD-10-CM | POA: Insufficient documentation

## 2018-01-16 DIAGNOSIS — I10 Essential (primary) hypertension: Secondary | ICD-10-CM | POA: Diagnosis not present

## 2018-01-16 DIAGNOSIS — Z79899 Other long term (current) drug therapy: Secondary | ICD-10-CM | POA: Insufficient documentation

## 2018-01-16 DIAGNOSIS — G629 Polyneuropathy, unspecified: Secondary | ICD-10-CM | POA: Insufficient documentation

## 2018-01-16 DIAGNOSIS — Z8673 Personal history of transient ischemic attack (TIA), and cerebral infarction without residual deficits: Secondary | ICD-10-CM | POA: Insufficient documentation

## 2018-01-16 DIAGNOSIS — Z9889 Other specified postprocedural states: Secondary | ICD-10-CM

## 2018-01-16 DIAGNOSIS — Z7982 Long term (current) use of aspirin: Secondary | ICD-10-CM | POA: Diagnosis not present

## 2018-01-16 DIAGNOSIS — L97529 Non-pressure chronic ulcer of other part of left foot with unspecified severity: Secondary | ICD-10-CM | POA: Diagnosis present

## 2018-01-16 HISTORY — PX: WOUND DEBRIDEMENT: SHX247

## 2018-01-16 HISTORY — PX: METATARSAL HEAD EXCISION: SHX5027

## 2018-01-16 SURGERY — EXCISION, METATARSAL BONE, HEAD
Anesthesia: Monitor Anesthesia Care | Site: Foot | Laterality: Left

## 2018-01-16 MED ORDER — MIDAZOLAM HCL 2 MG/2ML IJ SOLN
INTRAMUSCULAR | Status: AC
  Filled 2018-01-16: qty 2

## 2018-01-16 MED ORDER — LACTATED RINGERS IV SOLN: INTRAVENOUS | Status: DC

## 2018-01-16 MED ORDER — PROPOFOL 500 MG/50ML IV EMUL
INTRAVENOUS | Status: DC | PRN
  Administered 2018-01-16: 09:00:00 via INTRAVENOUS
  Administered 2018-01-16: 75 ug/kg/min via INTRAVENOUS

## 2018-01-16 MED ORDER — FENTANYL CITRATE (PF) 100 MCG/2ML IJ SOLN
INTRAMUSCULAR | Status: AC
  Filled 2018-01-16: qty 2

## 2018-01-16 MED ORDER — LACTATED RINGERS IV SOLN
INTRAVENOUS | Status: DC | PRN
  Administered 2018-01-16: 1000 mL
  Administered 2018-01-16: 07:00:00 via INTRAVENOUS

## 2018-01-16 MED ORDER — PROPOFOL 10 MG/ML IV BOLUS
INTRAVENOUS | Status: AC
  Filled 2018-01-16: qty 20

## 2018-01-16 MED ORDER — MIDAZOLAM HCL 5 MG/5ML IJ SOLN
INTRAMUSCULAR | Status: DC | PRN
  Administered 2018-01-16: 2 mg via INTRAVENOUS

## 2018-01-16 MED ORDER — CEFAZOLIN SODIUM-DEXTROSE 2-4 GM/100ML-% IV SOLN
2.0000 g | INTRAVENOUS | Status: AC
  Administered 2018-01-16: 2 g via INTRAVENOUS
  Filled 2018-01-16: qty 100

## 2018-01-16 MED ORDER — BUPIVACAINE HCL (PF) 0.5 % IJ SOLN
INTRAMUSCULAR | Status: DC | PRN
  Administered 2018-01-16: 18 mL

## 2018-01-16 MED ORDER — FENTANYL CITRATE (PF) 100 MCG/2ML IJ SOLN
INTRAMUSCULAR | Status: DC | PRN
  Administered 2018-01-16 (×2): 25 ug via INTRAVENOUS

## 2018-01-16 MED ORDER — 0.9 % SODIUM CHLORIDE (POUR BTL) OPTIME
TOPICAL | Status: DC | PRN
  Administered 2018-01-16: 1000 mL

## 2018-01-16 MED ORDER — BUPIVACAINE HCL (PF) 0.5 % IJ SOLN
INTRAMUSCULAR | Status: AC
  Filled 2018-01-16: qty 30

## 2018-01-16 MED ORDER — CHLORHEXIDINE GLUCONATE CLOTH 2 % EX PADS: 6.0000 | MEDICATED_PAD | Freq: Once | CUTANEOUS | Status: DC

## 2018-01-16 MED ORDER — CHLORHEXIDINE GLUCONATE CLOTH 2 % EX PADS
6.0000 | MEDICATED_PAD | Freq: Once | CUTANEOUS | Status: AC
  Administered 2018-01-16: 6 via TOPICAL

## 2018-01-16 SURGICAL SUPPLY — 35 items
BAG HAMPER (MISCELLANEOUS) ×3 IMPLANT
BANDAGE ELASTIC 4 LF NS (GAUZE/BANDAGES/DRESSINGS) ×3 IMPLANT
BANDAGE ESMARK 4X12 BL STRL LF (DISPOSABLE) ×2 IMPLANT
BENZOIN TINCTURE PRP APPL 2/3 (GAUZE/BANDAGES/DRESSINGS) ×3 IMPLANT
BLADE OSC/SAGITTAL MD 9X18.5 (BLADE) ×3 IMPLANT
BLADE SURG 15 STRL LF DISP TIS (BLADE) ×6 IMPLANT
BLADE SURG 15 STRL SS (BLADE) ×3
BNDG CONFORM 2 STRL LF (GAUZE/BANDAGES/DRESSINGS) ×3 IMPLANT
BNDG ESMARK 4X12 BLUE STRL LF (DISPOSABLE) ×3
BNDG GAUZE ELAST 4 BULKY (GAUZE/BANDAGES/DRESSINGS) ×3 IMPLANT
CLOTH BEACON ORANGE TIMEOUT ST (SAFETY) ×3 IMPLANT
COVER LIGHT HANDLE STERIS (MISCELLANEOUS) ×6 IMPLANT
CUFF TOURNIQUET SINGLE 18IN (TOURNIQUET CUFF) ×3 IMPLANT
DECANTER SPIKE VIAL GLASS SM (MISCELLANEOUS) ×3 IMPLANT
DRAPE OEC MINIVIEW 54X84 (DRAPES) ×3 IMPLANT
DRSG ADAPTIC 3X8 NADH LF (GAUZE/BANDAGES/DRESSINGS) ×3 IMPLANT
ELECT REM PT RETURN 9FT ADLT (ELECTROSURGICAL) ×3
ELECTRODE REM PT RTRN 9FT ADLT (ELECTROSURGICAL) ×2 IMPLANT
GAUZE SPONGE 4X4 12PLY STRL (GAUZE/BANDAGES/DRESSINGS) ×3 IMPLANT
GLOVE BIO SURGEON STRL SZ7.5 (GLOVE) ×3 IMPLANT
GLOVE BIOGEL PI IND STRL 7.0 (GLOVE) ×4 IMPLANT
GLOVE BIOGEL PI INDICATOR 7.0 (GLOVE) ×2
GOWN STRL REUS W/TWL LRG LVL3 (GOWN DISPOSABLE) ×6 IMPLANT
KIT TURNOVER KIT A (KITS) ×3 IMPLANT
MANIFOLD NEPTUNE II (INSTRUMENTS) ×3 IMPLANT
MARKER SKIN DUAL TIP RULER LAB (MISCELLANEOUS) ×3 IMPLANT
NEEDLE HYPO 27GX1-1/4 (NEEDLE) ×6 IMPLANT
NS IRRIG 1000ML POUR BTL (IV SOLUTION) ×3 IMPLANT
PACK BASIC LIMB (CUSTOM PROCEDURE TRAY) ×3 IMPLANT
PAD ARMBOARD 7.5X6 YLW CONV (MISCELLANEOUS) ×3 IMPLANT
SET BASIN LINEN APH (SET/KITS/TRAYS/PACK) ×3 IMPLANT
STRIP CLOSURE SKIN 1/2X4 (GAUZE/BANDAGES/DRESSINGS) ×3 IMPLANT
SUT PROLENE 4 0 PS 2 18 (SUTURE) ×3 IMPLANT
SUT VIC AB 4-0 PS2 27 (SUTURE) ×3 IMPLANT
SYR CONTROL 10ML LL (SYRINGE) ×6 IMPLANT

## 2018-01-16 NOTE — Anesthesia Postprocedure Evaluation (Signed)
Anesthesia Post Note  Patient: James Gillespie  Procedure(s) Performed: 5TH METATARSAL HEAD RESECTION (Left Fifth Toe) DEBRIDEMENT ULCERATION LEFT FOOT (Left Foot)  Patient location during evaluation: PACU Anesthesia Type: MAC Level of consciousness: awake and alert Pain management: pain level controlled Vital Signs Assessment: post-procedure vital signs reviewed and stable Respiratory status: spontaneous breathing, nonlabored ventilation and respiratory function stable Cardiovascular status: blood pressure returned to baseline Postop Assessment: no apparent nausea or vomiting Anesthetic complications: no     Last Vitals:  Vitals:   01/16/18 0945 01/16/18 1000  BP: (!) 156/81 (!) 163/84  Pulse: 62 71  Resp: 13 19  Temp:    SpO2: 95% 95%    Last Pain:  Vitals:   01/16/18 1000  TempSrc:   PainSc: 0-No pain                 James Gillespie

## 2018-01-16 NOTE — Op Note (Signed)
OPERATIVE NOTE  DATE OF PROCEDURE 01/16/2018  SURGEON Marcheta Grammes, DPM  ASSISTANT SURGEON None  OR STAFF Circulator: Towanda Malkin, RN Scrub Person: Karin Lieu, CST   PREOPERATIVE DIAGNOSIS 1.  Ulceration, left foot 2.  Peripheral neuropathy  POSTOPERATIVE DIAGNOSIS Same  PROCEDURE 1.  5th metatarsal head resection, left foot 2.  Debridement of ulceration, left foot  ANESTHESIA Monitor Anesthesia Care   HEMOSTASIS Pneumatic ankle tourniquet set at 250 mmHg  ESTIMATED BLOOD LOSS Minimal (<5 cc)  MATERIALS USED None  INJECTABLES 0.5% Marcaine plain  PATHOLOGY Left 5th metatarsal head  COMPLICATIONS None  INDICATIONS:  Chronic ulceration of the left foot nonresponsive to offloading and local wound care.  DESCRIPTION OF THE PROCEDURE:  The patient was brought to the operating room and placed on the operative table in the supine position.  A pneumatic ankle tourniquet was applied to the operative extremity.  Following sedation, the surgical site was anesthetized with 0.5% Marcaine plain.  The foot was then prepped, scrubbed, and draped in the usual sterile technique.  The foot was elevated, exsanguinated and the pneumatic ankle tourniquet inflated to 250 mmHg.    Attention was directed to the dorsal lateral aspect of the left foot.  A linear longitudinal incision was made lateral to the extensor tendon to the fifth digit.  Dissection was continued deep down to the level of the fifth metatarsophalangeal joint.  Hemostasis was achieved with electrocautery.  A linear longitudinal periosteal and capsular incision was performed.  The periosteal and capsular structures were reflected medially the and laterally exposing the fifth metatarsal head.  A power bone saw was used to perform an osteotomy proximal to the surgical neck.  The head of the fifth metatarsal was freed of all soft tissue attachments, removed and passed from the operative field.  The metatarsal  head was submitted to pathology for evaluation.  The surgical wound was irrigated with copious amounts of sterile irrigant.  The periosteal and capsular structures were reapproximated using 4-0 Vicryl.  The subcutaneous structures were reapproximated using 4-0 Vicryl.  The skin was reapproximated using 4-0 Prolene.  Wound closure was reinforced with Steri-Strips.  Attention was directed to the plantar aspect of the left foot.  A full-thickness ulceration was encountered measuring 0.2 x 0.6 x 0.2 cm.  The perimeter of the wound was comprised of pink viable tissue.  There was yellow fibrotic tissue centrally.  The ulceration required debridement of the adherent, nonviable fibrotic tissue.  A sharp excisional debridement of the ulceration was performed using a #15 blade and soft tissue nipper.  [Adherent, nonviable fibrotic tissue] including subcutaneous tissue was debrided and removed.  Following debridement, the ulceration measured 0.3 x 0.7 x 0.3 cm.  A sterile compressive dressing was applied to the left foot.  The pneumatic ankle tourniquet was deflated and a prompt hyperemic response was noted to all digits of the left foot.    The patient tolerated the procedure well.  The patient was then transferred to PACU with vital signs stable and vascular status intact to all toes of the operative foot.

## 2018-01-16 NOTE — Discharge Instructions (Signed)
These instructions will give you an idea of what to expect after surgery and how to manage issues that may arise before your first post op office visit.  Pain Management Pain is best managed by "staying ahead" of it. If pain gets out of control, it is difficult to get it back under control. Local anesthesia that lasts 6-8 hours is used to numb the foot and decrease pain.  For the best pain control, take the pain medication every 4 hours for the first 2 days post op. On the third day pain medication can be taken as needed.   Post Op Nausea Nausea is common after surgery, so it is managed proactively.  If prescribed, use the prescribed nausea medication regularly for the first 2 days post op.  Bandages Do not worry if there is blood on the bandage. What looks like a lot of blood on the bandage is actually a small amount. Blood on the dressing spreads out as it is absorbed by the gauze, the same way a drop of water spreads out on a paper towel.  If the bandages feel wet or dry, stiff and uncomfortable, call the office during office hours and we will schedule a time for you to have the bandage changed.  Unless you are specifically told otherwise, we will do the first bandage change in the office.  Keep your bandage dry. If the bandage becomes wet or soiled, notify the office and we will schedule a time to change the bandage.  Activity It is best to spend most of the first 2 days after surgery lying down with the foot elevated above the level of your heart. You may put weight on your heel while wearing the CAM Walker (black boot).   You may only get up to go to the restroom.  Driving Do not drive until you are able to respond in an emergency (i.e. slam on the brakes). This usually occurs after the bone has healed - 6 to 8 weeks.  Call the Office If you have a fever over 101F.  If you have increasing pain after the initial post op pain has settled down.  If you have increasing redness, swelling,  or drainage.  If you have any questions or concerns.   

## 2018-01-16 NOTE — Brief Op Note (Signed)
BRIEF OPERATIVE NOTE  DATE OF PROCEDURE 01/16/2018  SURGEON Marcheta Grammes, DPM  ASSISTANT SURGEON None  OR STAFF Circulator: Towanda Malkin, RN Scrub Person: Karin Lieu, CST   PREOPERATIVE DIAGNOSIS 1.  Ulceration, left foot 2.  Peripheral neuropathy  POSTOPERATIVE DIAGNOSIS Same  PROCEDURE 1.  5th metatarsal head resection, left foot 2.  Debridement of ulceration, left foot  ANESTHESIA Monitor Anesthesia Care   HEMOSTASIS Pneumatic ankle tourniquet set at 250 mmHg  ESTIMATED BLOOD LOSS Minimal (<5 cc)  MATERIALS USED None  INJECTABLES 0.5% Marcaine plain  PATHOLOGY Left 5th metatarsal head  COMPLICATIONS None

## 2018-01-16 NOTE — Transfer of Care (Signed)
Immediate Anesthesia Transfer of Care Note  Patient: James Gillespie  Procedure(s) Performed: 5TH METATARSAL HEAD RESECTION (Left Fifth Toe) DEBRIDEMENT ULCERATION LEFT FOOT (Left Foot)  Patient Location: PACU  Anesthesia Type:MAC  Level of Consciousness: awake, oriented and patient cooperative  Airway & Oxygen Therapy: Patient Spontanous Breathing and Patient connected to nasal cannula oxygen  Post-op Assessment: Report given to RN, Post -op Vital signs reviewed and stable and Patient moving all extremities  Post vital signs: Reviewed and stable  Last Vitals:  Vitals Value Taken Time  BP    Temp    Pulse    Resp    SpO2      Last Pain:  Vitals:   01/16/18 0710  TempSrc: Oral  PainSc: 0-No pain         Complications: No apparent anesthesia complications

## 2018-01-16 NOTE — H&P (Signed)
HISTORY AND PHYSICAL INTERVAL NOTE:  01/16/2018  8:19 AM  James Gillespie  has presented today for surgery, with the diagnosis of ulceration left foot, peripheral neuropathy.  The various methods of treatment have been discussed with the patient.  No guarantees were given.  After consideration of risks, benefits and other options for treatment, the patient has consented to surgery.  I have reviewed the patients' chart and labs.    Patient Vitals for the past 24 hrs:  BP Temp Temp src Pulse Resp SpO2  01/16/18 0750 - - - - - 96 %  01/16/18 0745 (!) 150/89 - - - (!) 26 98 %  01/16/18 0740 140/83 - - - 16 -  01/16/18 0735 (!) 142/83 - - - - -  01/16/18 0730 (!) 141/86 - - - - -  01/16/18 0710 (!) 144/82 97.8 F (36.6 C) Oral 72 17 96 %    A history and physical examination was performed in my office.  The patient was reexamined.  There have been no changes to this history and physical examination.  Marcheta Grammes, DPM

## 2018-01-16 NOTE — Anesthesia Preprocedure Evaluation (Signed)
Anesthesia Evaluation  Patient identified by MRN, date of birth, ID band Patient awake    Reviewed: Allergy & Precautions, H&P , NPO status , Patient's Chart, lab work & pertinent test results  Airway Mallampati: II  TM Distance: >3 FB Neck ROM: full    Dental no notable dental hx. (+) Teeth Intact   Pulmonary neg pulmonary ROS,    Pulmonary exam normal breath sounds clear to auscultation       Cardiovascular Exercise Tolerance: Good hypertension, On Medications negative cardio ROS   Rhythm:regular Rate:Normal     Neuro/Psych CVA, No Residual Symptoms negative neurological ROS  negative psych ROS   GI/Hepatic negative GI ROS, Neg liver ROS,   Endo/Other  negative endocrine ROS  Renal/GU negative Renal ROS  negative genitourinary   Musculoskeletal   Abdominal   Peds  Hematology negative hematology ROS (+)   Anesthesia Other Findings   Reproductive/Obstetrics negative OB ROS                             Anesthesia Physical Anesthesia Plan  ASA: III  Anesthesia Plan: MAC   Post-op Pain Management:    Induction:   PONV Risk Score and Plan:   Airway Management Planned:   Additional Equipment:   Intra-op Plan:   Post-operative Plan:   Informed Consent: I have reviewed the patients History and Physical, chart, labs and discussed the procedure including the risks, benefits and alternatives for the proposed anesthesia with the patient or authorized representative who has indicated his/her understanding and acceptance.   Dental Advisory Given  Plan Discussed with: CRNA  Anesthesia Plan Comments:         Anesthesia Quick Evaluation

## 2018-01-17 ENCOUNTER — Encounter (HOSPITAL_COMMUNITY): Payer: Self-pay | Admitting: Podiatry

## 2019-10-08 ENCOUNTER — Other Ambulatory Visit: Payer: Self-pay | Admitting: Podiatry

## 2019-10-20 ENCOUNTER — Other Ambulatory Visit (HOSPITAL_COMMUNITY): Payer: Medicare Other

## 2019-10-20 ENCOUNTER — Encounter (HOSPITAL_COMMUNITY): Admission: RE | Admit: 2019-10-20 | Payer: Medicare Other | Source: Ambulatory Visit

## 2019-10-22 ENCOUNTER — Encounter (HOSPITAL_COMMUNITY): Admission: RE | Payer: Self-pay | Source: Home / Self Care

## 2019-10-22 ENCOUNTER — Ambulatory Visit (HOSPITAL_COMMUNITY): Admission: RE | Admit: 2019-10-22 | Payer: Medicare Other | Source: Home / Self Care | Admitting: Podiatry

## 2019-10-22 SURGERY — OSTEOTOMY, WEIL
Anesthesia: Monitor Anesthesia Care | Laterality: Right

## 2019-11-02 IMAGING — DX DG FOOT COMPLETE 3+V*L*
3 series · 3 of 3 positions shown · non-contrast
Comparison: None.

CLINICAL DATA: Plantar foot ulcer at the head of the fifth
metatarsal.

EXAM:
LEFT FOOT - COMPLETE 3+ VIEW

[foot ap]
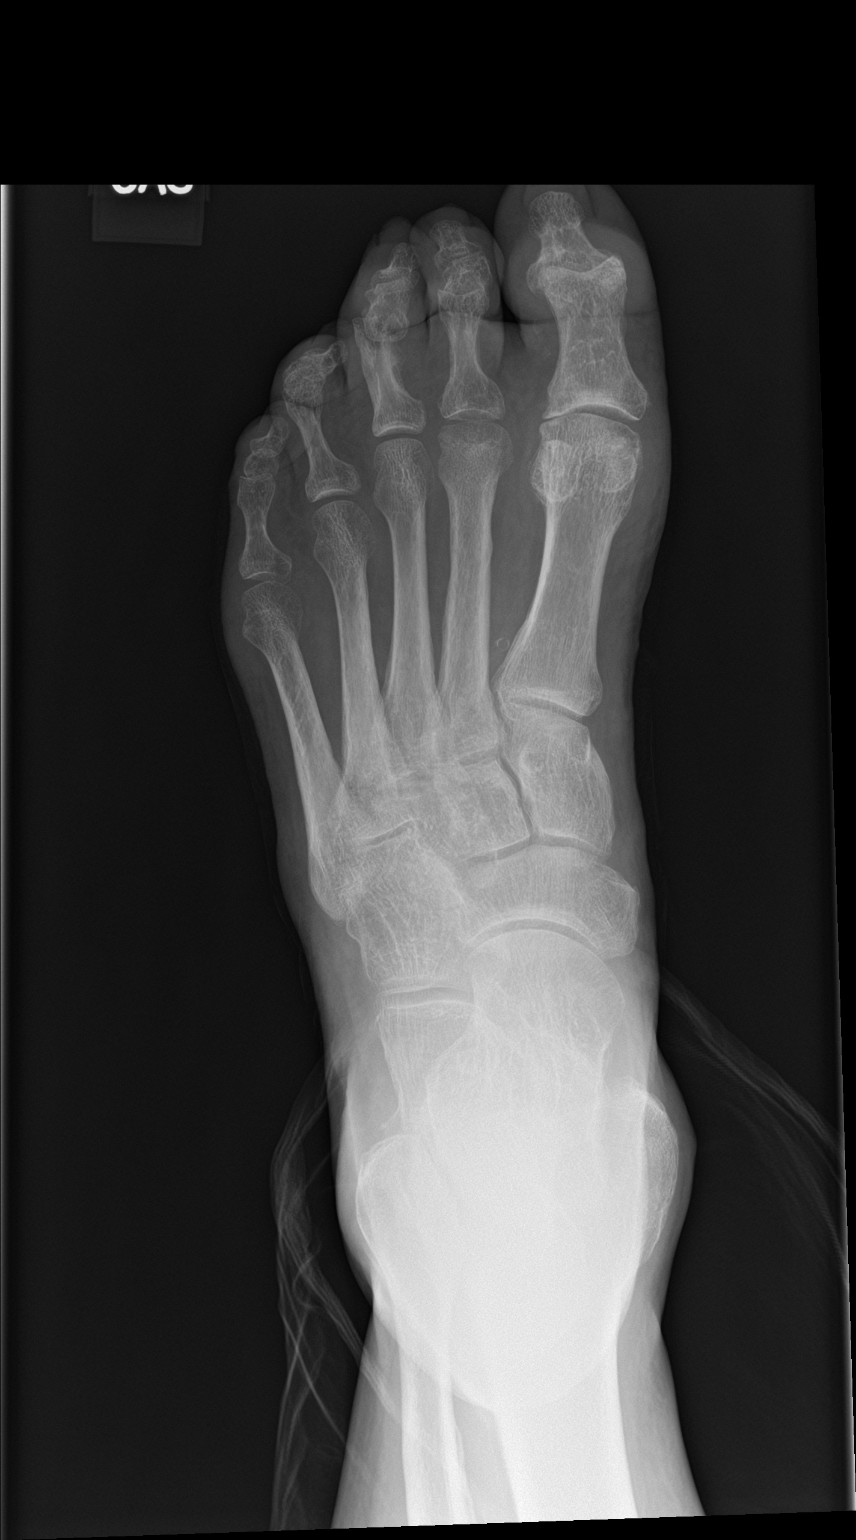

[foot obl]
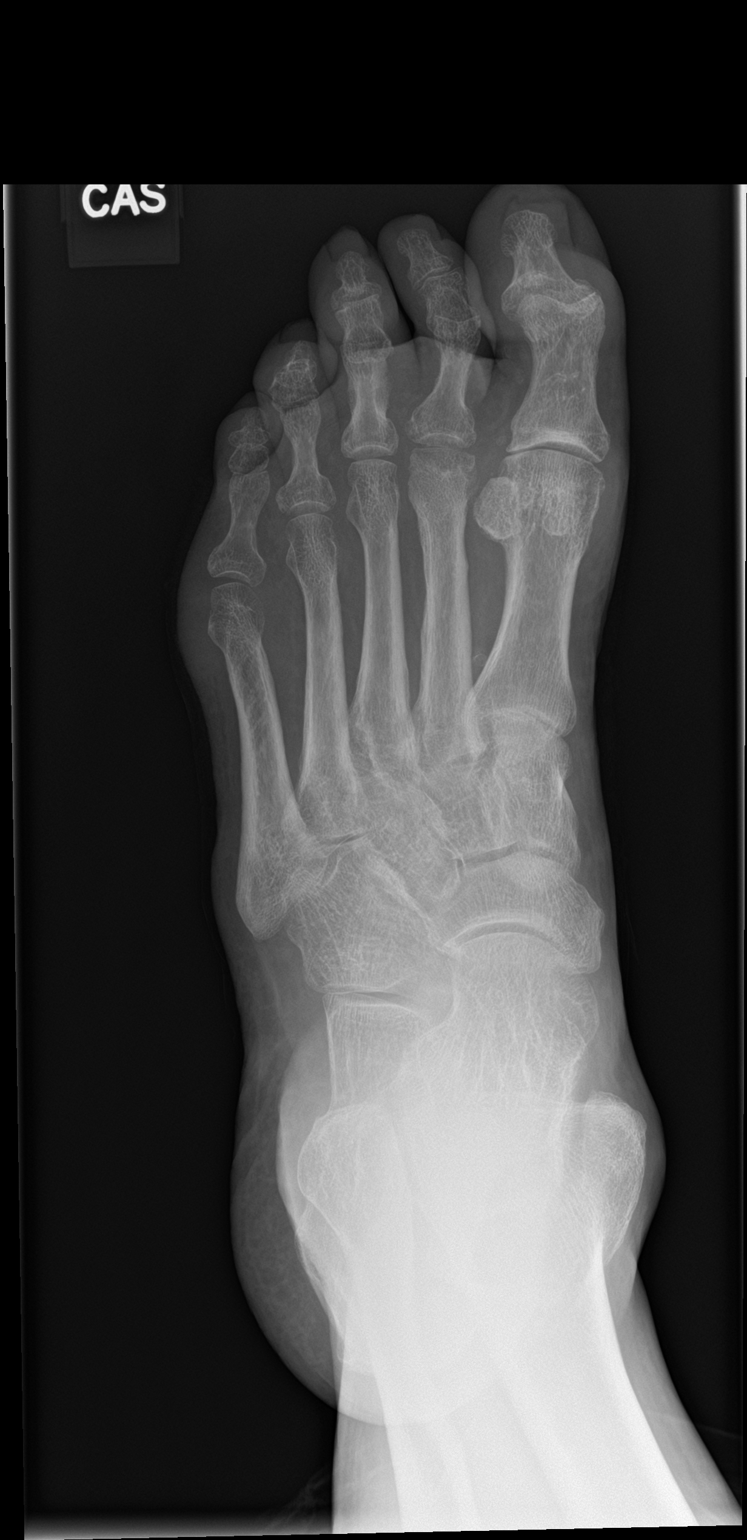

[foot lat]
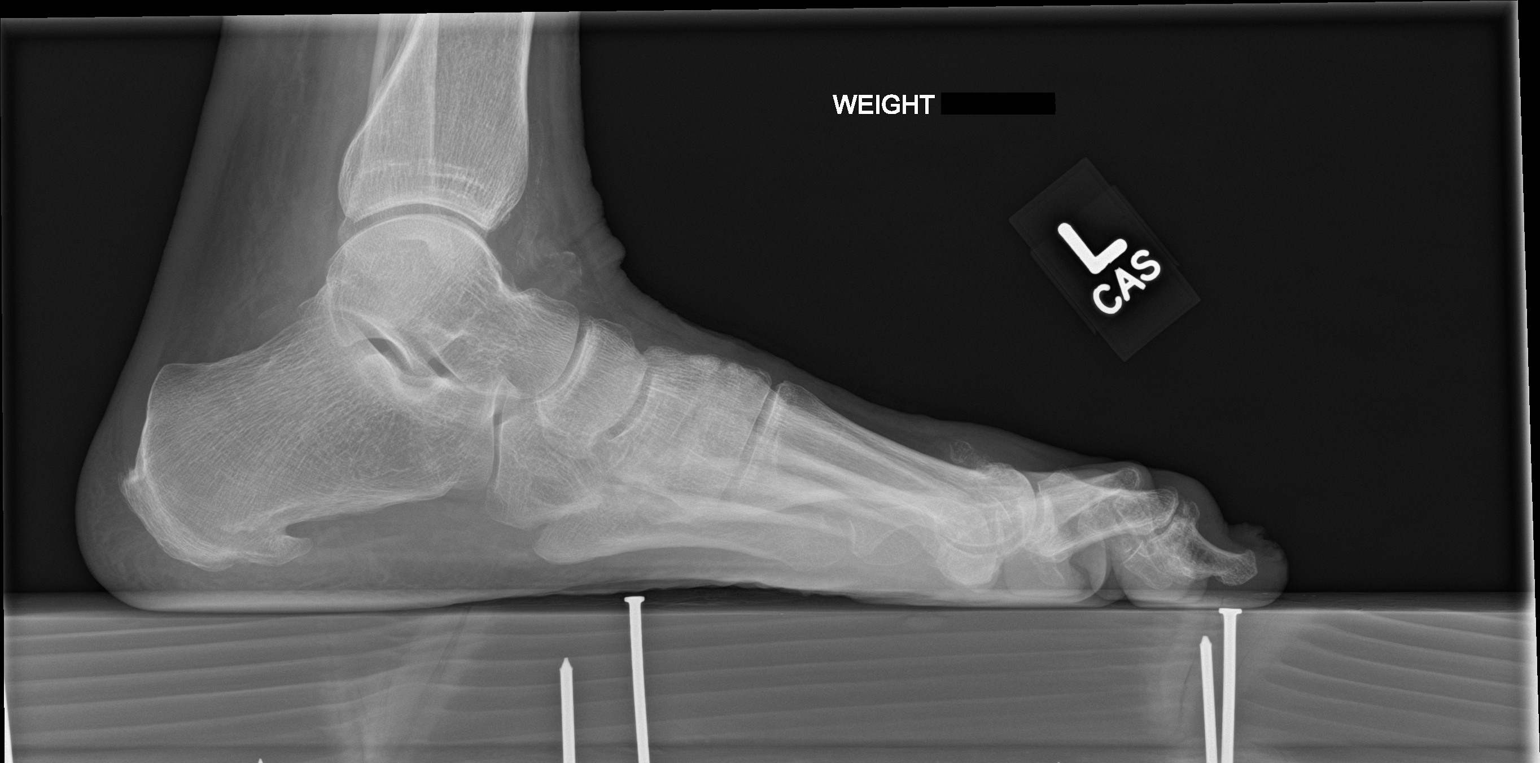

[3 of 3 positions shown; findings below may reference images not displayed]

FINDINGS: No acute fracture or dislocation. No cortical destruction or
osteolysis. Mild flattening of the second metatarsal head is likely
related to prior avascular necrosis. Mild osteoarthritis of the
first MTP joint with prominent dorsal spur. Hammertoe deformities of
the second and third toes. Diffuse osteopenia. Large plantar
enthesophyte. Vascular calcifications.
IMPRESSION: 1.  No acute osseous abnormality.
2. Old avascular necrosis of the second metatarsal head.
3. Mild first MTP joint osteoarthritis.

## 2019-11-25 ENCOUNTER — Other Ambulatory Visit: Payer: Self-pay | Admitting: Podiatry

## 2019-12-01 ENCOUNTER — Ambulatory Visit (HOSPITAL_COMMUNITY)
Admission: RE | Admit: 2019-12-01 | Discharge: 2019-12-01 | Disposition: A | Discharge status: Home / Self Care | Payer: Medicare Other | Source: Ambulatory Visit | Attending: Podiatry | Admitting: Podiatry

## 2019-12-01 ENCOUNTER — Other Ambulatory Visit: Payer: Self-pay

## 2019-12-01 ENCOUNTER — Encounter (HOSPITAL_COMMUNITY)
Admission: RE | Admit: 2019-12-01 | Discharge: 2019-12-01 | Disposition: A | Discharge status: Home / Self Care | Payer: Medicare Other | Source: Ambulatory Visit | Attending: Podiatry | Admitting: Podiatry

## 2019-12-01 ENCOUNTER — Encounter (HOSPITAL_COMMUNITY): Payer: Self-pay

## 2019-12-01 ENCOUNTER — Other Ambulatory Visit (HOSPITAL_COMMUNITY): Payer: Self-pay | Admitting: Podiatry

## 2019-12-01 ENCOUNTER — Other Ambulatory Visit (HOSPITAL_COMMUNITY)
Admission: RE | Admit: 2019-12-01 | Discharge: 2019-12-01 | Disposition: A | Discharge status: Home / Self Care | Payer: Medicare Other | Source: Ambulatory Visit | Attending: Podiatry | Admitting: Podiatry

## 2019-12-01 DIAGNOSIS — L97512 Non-pressure chronic ulcer of other part of right foot with fat layer exposed: Secondary | ICD-10-CM

## 2019-12-01 DIAGNOSIS — Z20822 Contact with and (suspected) exposure to covid-19: Secondary | ICD-10-CM | POA: Insufficient documentation

## 2019-12-01 DIAGNOSIS — Z01812 Encounter for preprocedural laboratory examination: Secondary | ICD-10-CM | POA: Insufficient documentation

## 2019-12-01 DIAGNOSIS — Z01818 Encounter for other preprocedural examination: Secondary | ICD-10-CM | POA: Diagnosis not present

## 2019-12-01 DIAGNOSIS — M19071 Primary osteoarthritis, right ankle and foot: Secondary | ICD-10-CM | POA: Insufficient documentation

## 2019-12-01 DIAGNOSIS — Z01810 Encounter for preprocedural cardiovascular examination: Secondary | ICD-10-CM | POA: Insufficient documentation

## 2019-12-01 LAB — BASIC METABOLIC PANEL
Anion gap: 12 (ref 5–15)
BUN: 24 mg/dL — ABNORMAL HIGH (ref 8–23)
CO2: 23 mmol/L (ref 22–32)
Calcium: 9.8 mg/dL (ref 8.9–10.3)
Chloride: 106 mmol/L (ref 98–111)
Creatinine, Ser: 1.39 mg/dL — ABNORMAL HIGH (ref 0.61–1.24)
GFR calc Af Amer: 60 mL/min — ABNORMAL LOW (ref >60)
GFR calc non Af Amer: 51 mL/min — ABNORMAL LOW (ref >60)
Glucose, Bld: 108 mg/dL — ABNORMAL HIGH (ref 70–99)
Potassium: 3.9 mmol/L (ref 3.5–5.1)
Sodium: 141 mmol/L (ref 135–145)

## 2019-12-01 LAB — HEMOGLOBIN A1C
Hgb A1c MFr Bld: 6 % — ABNORMAL HIGH (ref 4.8–5.6)
Mean Plasma Glucose: 125.5 mg/dL

## 2019-12-01 LAB — CBC WITH DIFFERENTIAL/PLATELET
Abs Immature Granulocytes: 0.05 10*3/uL (ref 0.00–0.07)
Basophils Absolute: 0.1 10*3/uL (ref 0.0–0.1)
Basophils Relative: 1 %
Eosinophils Absolute: 0.3 10*3/uL (ref 0.0–0.5)
Eosinophils Relative: 4 %
HCT: 47.3 % (ref 39.0–52.0)
Hemoglobin: 15.7 g/dL (ref 13.0–17.0)
Immature Granulocytes: 1 %
Lymphocytes Relative: 24 %
Lymphs Abs: 2.3 10*3/uL (ref 0.7–4.0)
MCH: 33.8 pg (ref 26.0–34.0)
MCHC: 33.2 g/dL (ref 30.0–36.0)
MCV: 101.7 fL — ABNORMAL HIGH (ref 80.0–100.0)
Monocytes Absolute: 1.2 10*3/uL — ABNORMAL HIGH (ref 0.1–1.0)
Monocytes Relative: 13 %
Neutro Abs: 5.5 10*3/uL (ref 1.7–7.7)
Neutrophils Relative %: 57 %
Platelets: 257 10*3/uL (ref 150–400)
RBC: 4.65 MIL/uL (ref 4.22–5.81)
RDW: 13.5 % (ref 11.5–15.5)
WBC: 9.4 10*3/uL (ref 4.0–10.5)
nRBC: 0 % (ref 0.0–0.2)

## 2019-12-01 LAB — SARS CORONAVIRUS 2 (TAT 6-24 HRS): SARS Coronavirus 2: NEGATIVE

## 2019-12-01 NOTE — Patient Instructions (Signed)
James Gillespie  12/01/2019     @PREFPERIOPPHARMACY @   Your procedure is scheduled on  12/03/2019 .  Report to [facility] at  1130   A.M.  Call this number if you have problems the morning of surgery:  [phone]   Remember:  Do not eat or drink after midnight.                      Take these medicines the morning of surgery with A SIP OF WATER amlodipine, gabapentin, lisinopril.    Do not wear jewelry, make-up or nail polish.  Do not wear lotions, powders, or perfumes, or deodorant.  Do not shave 48 hours prior to surgery.  Men may shave face and neck.  Do not bring valuables to the hospital.  [facility] is not responsible for any belongings or valuables.  Contacts, dentures or bridgework may not be worn into surgery.  Leave your suitcase in the car.  After surgery it may be brought to your room.  For patients admitted to the hospital, discharge time will be determined by your treatment team.  Patients discharged the day of surgery will not be allowed to drive home.   Name and phone number of your driver:   family Special instructions:  DO NOT smoke the day of your surgery.  Please read over the following fact sheets that you were given. Anesthesia Post-op Instructions and Care and Recovery After Surgery       Metatarsal Osteotomy, Care After This sheet gives you information about how to care for yourself after your procedure. Your health care provider may also give you more specific instructions. If you have problems or questions, contact your health care provider. What can I expect after the procedure? After the procedure, it is common to have:  Soreness.  Pain.  Stiffness.  Swelling. Follow these instructions at home: Medicines  Take over-the-counter and prescription medicines only as told by your health care provider.  Ask your health care provider if the medicine prescribed to you can cause constipation. You may need to take steps to  prevent or treat constipation, such as: ? Drink enough fluid to keep your urine pale yellow. ? Take over-the-counter or prescription medicines. ? Eat foods that are high in fiber, such as beans, whole grains, and fresh fruits and vegetables. ? Limit foods that are high in fat and processed sugars, such as fried or sweet foods. If you have a splint or walking boot:  Wear it as told by your health care provider. Remove it only as told by your health care provider.  Loosen it if your toes tingle, become numb, or turn cold and blue.  Keep it clean.  If it is not waterproof: ? Do not let it get wet. ? Cover it with a watertight covering when you take a bath or shower. Bathing  Do not take baths, swim, or use a hot tub until your health care provider approves. Ask your health care provider if you may take showers. You may only be allowed to take sponge baths.  Keep the bandage (dressing) dry. Incision care   Follow instructions from your health care provider about how to take care of your incision. Make sure you: ? Wash your hands with soap and water before you change your bandage (dressing). If soap and water are not available, use hand sanitizer. ? Change your dressing as told by your health care  provider. ? Leave stitches (sutures), skin glue, or adhesive strips in place. These skin closures may need to stay in place for 2 weeks or longer. If adhesive strip edges start to loosen and curl up, you may trim the loose edges. Do not remove adhesive strips completely unless your health care provider tells you to do that.  Check your incision area every day for signs of infection. Check for: ? More redness, swelling, or pain. ? Fluid or blood. ? Warmth. ? Pus or a bad smell. Managing pain, stiffness, and swelling   If directed, put ice on the injured area. ? If you have a removable splint, remove it as told by your health care provider. ? Put ice in a plastic bag. ? Place a towel  between your skin and the bag. ? Leave the ice on for 20 minutes, 2-3 times a day.  Move your toes often to avoid stiffness and to lessen swelling.  Raise (elevate) the injured area above the level of your heart while you are sitting or lying down. Driving  Do not drive or use heavy machinery while taking prescription pain medicine.  Do not drive for 24 hours if you were given a sedative during your procedure.  Ask your health care provider when it is safe to drive if you have a dressing, splint, special shoe, or walking boot on your foot. General instructions  Do not remove the bandage (dressing) around your foot until directed by your health care provider.  If you were given a splint, special shoe, or walking boot, wear it as told by your health care provider.  Do not use the injured limb to support your body weight until your health care provider says that you can. Use crutches or a walker as told by your health care provider.  Return to your normal activities as told by your health care provider. Ask your health care provider what activities are safe for you.  Do not use any products that contain nicotine or tobacco, such as cigarettes, e-cigarettes, and chewing tobacco. These can delay bone healing. If you need help quitting, ask your health care provider.  Keep all follow-up visits as told by your health care provider. This is important. Contact a health care provider if:  You have a fever.  Your dressing becomes wet, loose, or stained with blood or discharge.  You have pus or a bad smell coming from your incision or bandage.  Your foot becomes red, swollen, or tender.  You have pain or stiffness that does not get better or gets worse.  You have tingling or numbness in your foot that does not get better or gets worse. Get help right away if:  You develop a warm and tender swelling in your leg.  You have chest pain.  You have trouble breathing. Summary  After the  procedure, it is common to have soreness, pain, stiffness, and swelling.  Follow instructions on caring for your incision, changing your dressing, and using weight support to protect your foot.  Contact your health care provider if you have a fever, pus or a bad smell coming from your wound or dressing, or pain and stiffness do not get better.  Get help right away if you develop a warm and tender swelling in your leg, have chest pain, or trouble breathing. This information is not intended to replace advice given to you by your health care provider. Make sure you discuss any questions you have with your health care provider.  Document Revised: 08/09/2018 Document Reviewed: 08/09/2018 Elsevier Patient Education  [place].  Incision and Drainage, Care After This sheet gives you information about how to care for yourself after your procedure. Your health care provider may also give you more specific instructions. If you have problems or questions, contact your health care provider. What can I expect after the procedure? After the procedure, it is common to have:  Pain or discomfort around the incision site.  Blood, fluid, or pus (drainage) from the incision.  Redness and firm skin around the incision site. Follow these instructions at home: Medicines  Take over-the-counter and prescription medicines only as told by your health care provider.  If you were prescribed an antibiotic medicine, use or take it as told by your health care provider. Do not stop using the antibiotic even if you start to feel better. Wound care Follow instructions from your health care provider about how to take care of your wound. Make sure you:  Wash your hands with soap and water before and after you change your bandage (dressing). If soap and water are not available, use hand sanitizer.  Change your dressing and packing as told by your health care provider. ? If your dressing is dry or stuck when you  try to remove it, moisten or wet the dressing with saline or water so that it can be removed without harming your skin or tissues. ? If your wound is packed, leave it in place until your health care provider tells you to remove it. To remove the packing, moisten or wet the packing with saline or water so that it can be removed without harming your skin or tissues.  Leave stitches (sutures), skin glue, or adhesive strips in place. These skin closures may need to stay in place for 2 weeks or longer. If adhesive strip edges start to loosen and curl up, you may trim the loose edges. Do not remove adhesive strips completely unless your health care provider tells you to do that. Check your wound every day for signs of infection. Check for:  More redness, swelling, or pain.  More fluid or blood.  Warmth.  Pus or a bad smell. If you were sent home with a drain tube in place, follow instructions from your health care provider about:  How to empty it.  How to care for it at home.  General instructions  Rest the affected area.  Do not take baths, swim, or use a hot tub until your health care provider approves. Ask your health care provider if you may take showers. You may only be allowed to take sponge baths.  Return to your normal activities as told by your health care provider. Ask your health care provider what activities are safe for you. Your health care provider may put you on activity or lifting restrictions.  The incision will continue to drain. It is normal to have some clear or slightly bloody drainage. The amount of drainage should lessen each day.  Do not apply any creams, ointments, or liquids unless you have been told to by your health care provider.  Keep all follow-up visits as told by your health care provider. This is important. Contact a health care provider if:  Your cyst or abscess returns.  You have a fever or chills.  You have more redness, swelling, or pain around  your incision.  You have more fluid or blood coming from your incision.  Your incision feels warm to the touch.  You  have pus or a bad smell coming from your incision.  You have red streaks above or below the incision site. Get help right away if:  You have severe pain or bleeding.  You cannot eat or drink without vomiting.  You have decreased urine output.  You become short of breath.  You have chest pain.  You cough up blood.  The affected area becomes numb or starts to tingle. These symptoms may represent a serious problem that is an emergency. Do not wait to see if the symptoms will go away. Get medical help right away. Call your local emergency services (911 in the [place] Do not drive yourself to the hospital. Summary  After this procedure, it is common to have fluid, blood, or pus coming from the surgery site.  Follow all home care instructions. You will be told how to take care of your incision, how to check for infection, and how to take medicines.  If you were prescribed an antibiotic medicine, take it as told by your health care provider. Do not stop taking the antibiotic even if you start to feel better.  Contact a health care provider if you have increased redness, swelling, or pain around your incision. Get help right away if you have chest pain, you vomit, you cough up blood, or you have shortness of breath.  Keep all follow-up visits as told by your health care provider. This is important. This information is not intended to replace advice given to you by your health care provider. Make sure you discuss any questions you have with your health care provider. Document Revised: 08/26/2018 Document Reviewed: 08/26/2018 Elsevier Patient Education  2020 [place] Anesthesia, Adult, Care After This sheet gives you information about how to care for yourself after your procedure. Your health care provider may also give you more specific instructions. If  you have problems or questions, contact your health care provider. What can I expect after the procedure? After the procedure, the following side effects are common:  Pain or discomfort at the IV site.  Nausea.  Vomiting.  Sore throat.  Trouble concentrating.  Feeling cold or chills.  Weak or tired.  Sleepiness and fatigue.  Soreness and body aches. These side effects can affect parts of the body that were not involved in surgery. Follow these instructions at home:  For at least 24 hours after the procedure:  Have a responsible adult stay with you. It is important to have someone help care for you until you are awake and alert.  Rest as needed.  Do not: ? Participate in activities in which you could fall or become injured. ? Drive. ? Use heavy machinery. ? Drink alcohol. ? Take sleeping pills or medicines that cause drowsiness. ? Make important decisions or sign legal documents. ? Take care of children on your own. Eating and drinking  Follow any instructions from your health care provider about eating or drinking restrictions.  When you feel hungry, start by eating small amounts of foods that are soft and easy to digest (bland), such as toast. Gradually return to your regular diet.  Drink enough fluid to keep your urine pale yellow.  If you vomit, rehydrate by drinking water, juice, or clear broth. General instructions  If you have sleep apnea, surgery and certain medicines can increase your risk for breathing problems. Follow instructions from your health care provider about wearing your sleep device: ? Anytime you are sleeping, including during daytime naps. ? While taking prescription  pain medicines, sleeping medicines, or medicines that make you drowsy.  Return to your normal activities as told by your health care provider. Ask your health care provider what activities are safe for you.  Take over-the-counter and prescription medicines only as told by your  health care provider.  If you smoke, do not smoke without supervision.  Keep all follow-up visits as told by your health care provider. This is important. Contact a health care provider if:  You have nausea or vomiting that does not get better with medicine.  You cannot eat or drink without vomiting.  You have pain that does not get better with medicine.  You are unable to pass urine.  You develop a skin rash.  You have a fever.  You have redness around your IV site that gets worse. Get help right away if:  You have difficulty breathing.  You have chest pain.  You have blood in your urine or stool, or you vomit blood. Summary  After the procedure, it is common to have a sore throat or nausea. It is also common to feel tired.  Have a responsible adult stay with you for the first 24 hours after general anesthesia. It is important to have someone help care for you until you are awake and alert.  When you feel hungry, start by eating small amounts of foods that are soft and easy to digest (bland), such as toast. Gradually return to your regular diet.  Drink enough fluid to keep your urine pale yellow.  Return to your normal activities as told by your health care provider. Ask your health care provider what activities are safe for you. This information is not intended to replace advice given to you by your health care provider. Make sure you discuss any questions you have with your health care provider. Document Revised: 09/28/2017 Document Reviewed: 05/11/2017 Elsevier Patient Education  [place]. How to Use Chlorhexidine for Bathing Chlorhexidine gluconate (CHG) is a germ-killing (antiseptic) solution that is used to clean the skin. It can get rid of the bacteria that normally live on the skin and can keep them away for about 24 hours. To clean your skin with CHG, you may be given:  A CHG solution to use in the shower or as part of a sponge bath.  A prepackaged  cloth that contains CHG. Cleaning your skin with CHG may help lower the risk for infection:  While you are staying in the intensive care unit of the hospital.  If you have a vascular access, such as a central line, to provide short-term or long-term access to your veins.  If you have a catheter to drain urine from your bladder.  If you are on a ventilator. A ventilator is a machine that helps you breathe by moving air in and out of your lungs.  After surgery. What are the risks? Risks of using CHG include:  A skin reaction.  Hearing loss, if CHG gets in your ears.  Eye injury, if CHG gets in your eyes and is not rinsed out.  The CHG product catching fire. Make sure that you avoid smoking and flames after applying CHG to your skin. Do not use CHG:  If you have a chlorhexidine allergy or have previously reacted to chlorhexidine.  On babies younger than 74 months of age. How to use CHG solution  Use CHG only as told by your health care provider, and follow the instructions on the label.  Use the full amount  of CHG as directed. Usually, this is one bottle. During a shower Follow these steps when using CHG solution during a shower (unless your health care provider gives you different instructions): 1. Start the shower. 2. Use your normal soap and shampoo to wash your face and hair. 3. Turn off the shower or move out of the shower stream. 4. Pour the CHG onto a clean washcloth. Do not use any type of brush or rough-edged sponge. 5. Starting at your neck, lather your body down to your toes. Make sure you follow these instructions: ? If you will be having surgery, pay special attention to the part of your body where you will be having surgery. Scrub this area for at least 1 minute. ? Do not use CHG on your head or face. If the solution gets into your ears or eyes, rinse them well with water. ? Avoid your genital area. ? Avoid any areas of skin that have broken skin, cuts, or  scrapes. ? Scrub your back and under your arms. Make sure to wash skin folds. 6. Let the lather sit on your skin for 1-2 minutes or as long as told by your health care provider. 7. Thoroughly rinse your entire body in the shower. Make sure that all body creases and crevices are rinsed well. 8. Dry off with a clean towel. Do not put any substances on your body afterward--such as powder, lotion, or perfume--unless you are told to do so by your health care provider. Only use lotions that are recommended by the manufacturer. 9. Put on clean clothes or pajamas. 10. If it is the night before your surgery, sleep in clean sheets.  During a sponge bath Follow these steps when using CHG solution during a sponge bath (unless your health care provider gives you different instructions): 1. Use your normal soap and shampoo to wash your face and hair. 2. Pour the CHG onto a clean washcloth. 3. Starting at your neck, lather your body down to your toes. Make sure you follow these instructions: ? If you will be having surgery, pay special attention to the part of your body where you will be having surgery. Scrub this area for at least 1 minute. ? Do not use CHG on your head or face. If the solution gets into your ears or eyes, rinse them well with water. ? Avoid your genital area. ? Avoid any areas of skin that have broken skin, cuts, or scrapes. ? Scrub your back and under your arms. Make sure to wash skin folds. 4. Let the lather sit on your skin for 1-2 minutes or as long as told by your health care provider. 5. Using a different clean, wet washcloth, thoroughly rinse your entire body. Make sure that all body creases and crevices are rinsed well. 6. Dry off with a clean towel. Do not put any substances on your body afterward--such as powder, lotion, or perfume--unless you are told to do so by your health care provider. Only use lotions that are recommended by the manufacturer. 7. Put on clean clothes or  pajamas. 8. If it is the night before your surgery, sleep in clean sheets. How to use CHG prepackaged cloths  Only use CHG cloths as told by your health care provider, and follow the instructions on the label.  Use the CHG cloth on clean, dry skin.  Do not use the CHG cloth on your head or face unless your health care provider tells you to.  When washing with the  CHG cloth: ? Avoid your genital area. ? Avoid any areas of skin that have broken skin, cuts, or scrapes. Before surgery Follow these steps when using a CHG cloth to clean before surgery (unless your health care provider gives you different instructions): 1. Using the CHG cloth, vigorously scrub the part of your body where you will be having surgery. Scrub using a back-and-forth motion for 3 minutes. The area on your body should be completely wet with CHG when you are done scrubbing. 2. Do not rinse. Discard the cloth and let the area air-dry. Do not put any substances on the area afterward, such as powder, lotion, or perfume. 3. Put on clean clothes or pajamas. 4. If it is the night before your surgery, sleep in clean sheets.  For general bathing Follow these steps when using CHG cloths for general bathing (unless your health care provider gives you different instructions). 1. Use a separate CHG cloth for each area of your body. Make sure you wash between any folds of skin and between your fingers and toes. Wash your body in the following order, switching to a new cloth after each step: ? The front of your neck, shoulders, and chest. ? Both of your arms, under your arms, and your hands. ? Your stomach and groin area, avoiding the genitals. ? Your right leg and foot. ? Your left leg and foot. ? The back of your neck, your back, and your buttocks. 2. Do not rinse. Discard the cloth and let the area air-dry. Do not put any substances on your body afterward--such as powder, lotion, or perfume--unless you are told to do so by your  health care provider. Only use lotions that are recommended by the manufacturer. 3. Put on clean clothes or pajamas. Contact a health care provider if:  Your skin gets irritated after scrubbing.  You have questions about using your solution or cloth. Get help right away if:  Your eyes become very red or swollen.  Your eyes itch badly.  Your skin itches badly and is red or swollen.  Your hearing changes.  You have trouble seeing.  You have swelling or tingling in your mouth or throat.  You have trouble breathing.  You swallow any chlorhexidine. Summary  Chlorhexidine gluconate (CHG) is a germ-killing (antiseptic) solution that is used to clean the skin. Cleaning your skin with CHG may help to lower your risk for infection.  You may be given CHG to use for bathing. It may be in a bottle or in a prepackaged cloth to use on your skin. Carefully follow your health care provider's instructions and the instructions on the product label.  Do not use CHG if you have a chlorhexidine allergy.  Contact your health care provider if your skin gets irritated after scrubbing. This information is not intended to replace advice given to you by your health care provider. Make sure you discuss any questions you have with your health care provider. Document Revised: 12/12/2018 Document Reviewed: 08/23/2017 Elsevier Patient Education  [place].

## 2019-12-03 ENCOUNTER — Ambulatory Visit (HOSPITAL_COMMUNITY): Payer: Medicare Other | Admitting: Anesthesiology

## 2019-12-03 ENCOUNTER — Ambulatory Visit (HOSPITAL_COMMUNITY)
Admission: RE | Admit: 2019-12-03 | Discharge: 2019-12-03 | Disposition: A | Discharge status: Home / Self Care | Payer: Medicare Other | Attending: Podiatry | Admitting: Podiatry

## 2019-12-03 ENCOUNTER — Encounter (HOSPITAL_COMMUNITY)
Admission: RE | Disposition: A | Discharge status: Home / Self Care | Payer: Self-pay | Source: Home / Self Care | Attending: Podiatry

## 2019-12-03 ENCOUNTER — Ambulatory Visit (HOSPITAL_COMMUNITY): Payer: Medicare Other

## 2019-12-03 ENCOUNTER — Encounter (HOSPITAL_COMMUNITY): Payer: Self-pay | Admitting: Podiatry

## 2019-12-03 DIAGNOSIS — Z79899 Other long term (current) drug therapy: Secondary | ICD-10-CM | POA: Diagnosis not present

## 2019-12-03 DIAGNOSIS — Z8673 Personal history of transient ischemic attack (TIA), and cerebral infarction without residual deficits: Secondary | ICD-10-CM | POA: Diagnosis not present

## 2019-12-03 DIAGNOSIS — I10 Essential (primary) hypertension: Secondary | ICD-10-CM | POA: Diagnosis not present

## 2019-12-03 DIAGNOSIS — G629 Polyneuropathy, unspecified: Secondary | ICD-10-CM | POA: Insufficient documentation

## 2019-12-03 DIAGNOSIS — L97512 Non-pressure chronic ulcer of other part of right foot with fat layer exposed: Secondary | ICD-10-CM | POA: Insufficient documentation

## 2019-12-03 DIAGNOSIS — Z7982 Long term (current) use of aspirin: Secondary | ICD-10-CM | POA: Insufficient documentation

## 2019-12-03 DIAGNOSIS — E785 Hyperlipidemia, unspecified: Secondary | ICD-10-CM | POA: Insufficient documentation

## 2019-12-03 DIAGNOSIS — Z9889 Other specified postprocedural states: Secondary | ICD-10-CM

## 2019-12-03 HISTORY — PX: WEIL OSTEOTOMY: SHX5044

## 2019-12-03 HISTORY — PX: INCISION AND DRAINAGE OF WOUND: SHX1803

## 2019-12-03 SURGERY — OSTEOTOMY, WEIL
Anesthesia: Monitor Anesthesia Care | Site: Foot | Laterality: Right

## 2019-12-03 MED ORDER — HYDROCODONE-ACETAMINOPHEN 7.5-325 MG PO TABS: 1.0000 | ORAL_TABLET | Freq: Once | ORAL | Status: DC | PRN

## 2019-12-03 MED ORDER — ONDANSETRON HCL 4 MG/2ML IJ SOLN
INTRAMUSCULAR | Status: AC
  Filled 2019-12-03: qty 2

## 2019-12-03 MED ORDER — ONDANSETRON HCL 4 MG/2ML IJ SOLN
INTRAMUSCULAR | Status: DC | PRN
  Administered 2019-12-03: 4 mg via INTRAVENOUS

## 2019-12-03 MED ORDER — ARTIFICIAL TEARS OPHTHALMIC OINT
TOPICAL_OINTMENT | OPHTHALMIC | Status: AC
  Filled 2019-12-03: qty 3.5

## 2019-12-03 MED ORDER — PROMETHAZINE HCL 25 MG/ML IJ SOLN: 6.2500 mg | INTRAMUSCULAR | Status: DC | PRN

## 2019-12-03 MED ORDER — PROPOFOL 500 MG/50ML IV EMUL
INTRAVENOUS | Status: DC | PRN
  Administered 2019-12-03: 175 ug/kg/min via INTRAVENOUS
  Administered 2019-12-03: 50 ug/kg/min via INTRAVENOUS

## 2019-12-03 MED ORDER — BUPIVACAINE HCL (PF) 0.5 % IJ SOLN
INTRAMUSCULAR | Status: DC | PRN
  Administered 2019-12-03 (×2): 10 mL

## 2019-12-03 MED ORDER — EPHEDRINE 5 MG/ML INJ
INTRAVENOUS | Status: AC
  Filled 2019-12-03: qty 10

## 2019-12-03 MED ORDER — KETAMINE HCL 10 MG/ML IJ SOLN
INTRAMUSCULAR | Status: DC | PRN
  Administered 2019-12-03: 20 mg via INTRAVENOUS

## 2019-12-03 MED ORDER — LIDOCAINE 2% (20 MG/ML) 5 ML SYRINGE
INTRAMUSCULAR | Status: AC
  Filled 2019-12-03: qty 25

## 2019-12-03 MED ORDER — CHLORHEXIDINE GLUCONATE CLOTH 2 % EX PADS: 6.0000 | MEDICATED_PAD | Freq: Once | CUTANEOUS | Status: DC

## 2019-12-03 MED ORDER — FENTANYL CITRATE (PF) 100 MCG/2ML IJ SOLN
INTRAMUSCULAR | Status: DC | PRN
  Administered 2019-12-03: 25 ug via INTRAVENOUS

## 2019-12-03 MED ORDER — KETAMINE HCL 50 MG/5ML IJ SOSY
PREFILLED_SYRINGE | INTRAMUSCULAR | Status: AC
  Filled 2019-12-03: qty 5

## 2019-12-03 MED ORDER — CEFAZOLIN SODIUM-DEXTROSE 2-4 GM/100ML-% IV SOLN
2.0000 g | INTRAVENOUS | Status: AC
  Administered 2019-12-03: 2 g via INTRAVENOUS
  Filled 2019-12-03: qty 100

## 2019-12-03 MED ORDER — SEVOFLURANE IN SOLN
RESPIRATORY_TRACT | Status: AC
  Filled 2019-12-03: qty 250

## 2019-12-03 MED ORDER — PROPOFOL 10 MG/ML IV BOLUS
INTRAVENOUS | Status: AC
  Filled 2019-12-03: qty 40

## 2019-12-03 MED ORDER — LACTATED RINGERS IV SOLN
INTRAVENOUS | Status: DC
  Administered 2019-12-03: 1000 mL via INTRAVENOUS

## 2019-12-03 MED ORDER — FENTANYL CITRATE (PF) 100 MCG/2ML IJ SOLN
INTRAMUSCULAR | Status: AC
  Filled 2019-12-03: qty 2

## 2019-12-03 MED ORDER — MIDAZOLAM HCL 2 MG/2ML IJ SOLN: 0.5000 mg | Freq: Once | INTRAMUSCULAR | Status: DC | PRN

## 2019-12-03 MED ORDER — LIDOCAINE HCL (PF) 0.5 % IJ SOLN
INTRAMUSCULAR | Status: AC
  Filled 2019-12-03: qty 50

## 2019-12-03 MED ORDER — HYDROMORPHONE HCL 1 MG/ML IJ SOLN: 0.2500 mg | INTRAMUSCULAR | Status: DC | PRN

## 2019-12-03 MED ORDER — PHENYLEPHRINE 40 MCG/ML (10ML) SYRINGE FOR IV PUSH (FOR BLOOD PRESSURE SUPPORT)
PREFILLED_SYRINGE | INTRAVENOUS | Status: AC
  Filled 2019-12-03: qty 10

## 2019-12-03 MED ORDER — SUCCINYLCHOLINE CHLORIDE 200 MG/10ML IV SOSY
PREFILLED_SYRINGE | INTRAVENOUS | Status: AC
  Filled 2019-12-03: qty 30

## 2019-12-03 MED ORDER — LIDOCAINE HCL (PF) 1 % IJ SOLN
INTRAMUSCULAR | Status: AC
  Filled 2019-12-03: qty 30

## 2019-12-03 MED ORDER — 0.9 % SODIUM CHLORIDE (POUR BTL) OPTIME
TOPICAL | Status: DC | PRN
  Administered 2019-12-03: 12:00:00 1000 mL

## 2019-12-03 MED ORDER — BUPIVACAINE HCL (PF) 0.5 % IJ SOLN
INTRAMUSCULAR | Status: AC
  Filled 2019-12-03: qty 30

## 2019-12-03 MED ORDER — PROPOFOL 10 MG/ML IV BOLUS
INTRAVENOUS | Status: DC | PRN
  Administered 2019-12-03 (×2): 60 mg via INTRAVENOUS

## 2019-12-03 SURGICAL SUPPLY — 50 items
BANDAGE ELASTIC 4 VELCRO NS (GAUZE/BANDAGES/DRESSINGS) ×2 IMPLANT
BANDAGE ESMARK 4X12 BL STRL LF (DISPOSABLE) ×1 IMPLANT
BENZOIN TINCTURE PRP APPL 2/3 (GAUZE/BANDAGES/DRESSINGS) ×3 IMPLANT
BIT DRILL 2.0 HCS 150 (BIT) ×2 IMPLANT
BLADE AVERAGE 25MMX9MM (BLADE) ×1
BLADE AVERAGE 25X9 (BLADE) ×2 IMPLANT
BLADE SURG 15 STRL LF DISP TIS (BLADE) ×2 IMPLANT
BLADE SURG 15 STRL SS (BLADE) ×4
BNDG CONFORM 2 STRL LF (GAUZE/BANDAGES/DRESSINGS) ×2 IMPLANT
BNDG ELASTIC 4X5.8 VLCR NS LF (GAUZE/BANDAGES/DRESSINGS) ×3 IMPLANT
BNDG ESMARK 4X12 BLUE STRL LF (DISPOSABLE) ×3
BNDG GAUZE ELAST 4 BULKY (GAUZE/BANDAGES/DRESSINGS) ×3 IMPLANT
CLOSURE WOUND 1/2 X4 (GAUZE/BANDAGES/DRESSINGS) ×1
CLOTH BEACON ORANGE TIMEOUT ST (SAFETY) ×3 IMPLANT
COVER LIGHT HANDLE STERIS (MISCELLANEOUS) ×6 IMPLANT
COVER WAND RF STERILE (DRAPES) ×3 IMPLANT
CUFF TOURN SGL QUICK 18X4 (TOURNIQUET CUFF) ×2 IMPLANT
DECANTER SPIKE VIAL GLASS SM (MISCELLANEOUS) ×4 IMPLANT
DRAPE OEC MINIVIEW 54X84 (DRAPES) ×3 IMPLANT
DRSG ADAPTIC 3X8 NADH LF (GAUZE/BANDAGES/DRESSINGS) ×3 IMPLANT
ELECT REM PT RETURN 9FT ADLT (ELECTROSURGICAL) ×3
ELECTRODE REM PT RTRN 9FT ADLT (ELECTROSURGICAL) ×1 IMPLANT
GAUZE SPONGE 4X4 12PLY STRL (GAUZE/BANDAGES/DRESSINGS) ×3 IMPLANT
GLOVE BIO SURGEON STRL SZ7 (GLOVE) ×2 IMPLANT
GLOVE BIO SURGEON STRL SZ7.5 (GLOVE) ×3 IMPLANT
GLOVE BIOGEL PI IND STRL 7.0 (GLOVE) ×2 IMPLANT
GLOVE BIOGEL PI IND STRL 7.5 (GLOVE) ×1 IMPLANT
GLOVE BIOGEL PI INDICATOR 7.0 (GLOVE) ×4
GLOVE BIOGEL PI INDICATOR 7.5 (GLOVE) ×2
GLOVE ECLIPSE 7.0 STRL STRAW (GLOVE) ×3 IMPLANT
GOWN STRL REUS W/ TWL LRG LVL3 (GOWN DISPOSABLE) ×1 IMPLANT
GOWN STRL REUS W/TWL LRG LVL3 (GOWN DISPOSABLE) ×8 IMPLANT
K-WIRE NON THREAD 1.1 (WIRE) ×3
KWIRE NON THREAD 1.1 (WIRE) IMPLANT
MANIFOLD NEPTUNE II (INSTRUMENTS) ×3 IMPLANT
NDL HYPO 27GX1-1/4 (NEEDLE) ×2 IMPLANT
NEEDLE HYPO 27GX1-1/4 (NEEDLE) ×6 IMPLANT
NS IRRIG 1000ML POUR BTL (IV SOLUTION) ×3 IMPLANT
PACK BASIC LIMB (CUSTOM PROCEDURE TRAY) ×3 IMPLANT
PAD ARMBOARD 7.5X6 YLW CONV (MISCELLANEOUS) ×3 IMPLANT
PENCIL SMOKE EVACUATOR (MISCELLANEOUS) ×2 IMPLANT
SCREW HEADLESS COMPRESS 10X2.4 (Screw) ×2 IMPLANT
SET BASIN LINEN APH (SET/KITS/TRAYS/PACK) ×3 IMPLANT
SPONGE LAP 4X18 RFD (DISPOSABLE) ×6 IMPLANT
STRIP CLOSURE SKIN 1/2X4 (GAUZE/BANDAGES/DRESSINGS) ×3 IMPLANT
SUT VIC AB 2-0 CT2 27 (SUTURE) ×5 IMPLANT
SUT VIC AB 4-0 PS2 27 (SUTURE) ×3 IMPLANT
SUT VICRYL AB 3-0 FS1 BRD 27IN (SUTURE) ×3 IMPLANT
SYR BULB IRRIGATION 50ML (SYRINGE) ×3 IMPLANT
SYR CONTROL 10ML LL (SYRINGE) ×6 IMPLANT

## 2019-12-03 NOTE — Anesthesia Postprocedure Evaluation (Signed)
Anesthesia Post Note  Patient: James Gillespie  Procedure(s) Performed: WEIL OSTEOTOMY 3rd METATARSAL FOOT (Right Foot) DEBRIDEMENT ULCERATION 3rd METATARSAL RIGHT FOOT (Right Foot)  Patient location during evaluation: PACU Anesthesia Type: MAC Level of consciousness: awake and alert and patient cooperative Pain management: pain level controlled Vital Signs Assessment: post-procedure vital signs reviewed and stable Respiratory status: spontaneous breathing Cardiovascular status: stable Postop Assessment: no apparent nausea or vomiting Anesthetic complications: no     Last Vitals:  Vitals:   12/03/19 1320 12/03/19 1330  BP: (!) 141/75   Pulse: 79 83  Resp: 15 15  Temp:    SpO2: 97% 97%    Last Pain:  Vitals:   12/03/19 1315  TempSrc:   PainSc: 0-No pain                 Tameem Pullara

## 2019-12-03 NOTE — Anesthesia Preprocedure Evaluation (Signed)
Anesthesia Evaluation  Patient identified by MRN, date of birth, ID band Patient awake    Reviewed: Allergy & Precautions, NPO status , Patient's Chart, lab work & pertinent test results  Airway Mallampati: II  TM Distance: >3 FB Neck ROM: Full    Dental no notable dental hx. (+) Teeth Intact   Pulmonary neg pulmonary ROS,    Pulmonary exam normal breath sounds clear to auscultation       Cardiovascular Exercise Tolerance: Poor hypertension, Pt. 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     Endo/Other  negative endocrine ROS  Renal/GU negative Renal ROS  negative genitourinary   Musculoskeletal  (+) Arthritis , Osteoarthritis,    Abdominal   Peds negative pediatric ROS (+)  Hematology negative hematology ROS (+)   Anesthesia Other Findings   Reproductive/Obstetrics negative OB ROS                             Anesthesia Physical Anesthesia Plan  ASA: III  Anesthesia Plan: MAC   Post-op Pain Management:    Induction: Intravenous  PONV Risk Score and Plan: 2 and Propofol infusion, TIVA, Midazolam and Treatment may vary due to age or medical condition  Airway Management Planned: Nasal Cannula and Simple Face Mask  Additional Equipment:   Intra-op Plan:   Post-operative Plan:   Informed Consent: I have reviewed the patients History and Physical, chart, labs and discussed the procedure including the risks, benefits and alternatives for the proposed anesthesia  with the patient or authorized representative who has indicated his/her understanding and acceptance.     Dental advisory given  Plan Discussed with: CRNA  Anesthesia Plan Comments: (Plan Full PPE use  Plan MAC d/w pt -WTP with same after Q&A  D/w pt GA vs GETA as needed - neuropathic )        Anesthesia Quick Evaluation

## 2019-12-03 NOTE — Op Note (Signed)
OPERATIVE NOTE  DATE OF PROCEDURE 12/03/2019  SURGEON Marcheta Grammes, DPM  ASSISTANT SURGEON Jilda Panda, DPM  OR STAFF Circulator: Cox, Gershon Mussel, RN Relief Circulator: Glory Rosebush, RN Scrub Person: Karin Lieu, [place] [employer] Surgical Tech: Angelena Form A   PREOPERATIVE DIAGNOSIS 1.  Chronic ulceration of the right foot 3.  Peripheral neuropathy   POSTOPERATIVE DIAGNOSIS Same  PROCEDURE 1.  Weil osteotomy of the 3rd metatarsal of the right foot 2.  Excisional debridement of skin and subcutaneous tissue of the right foot  ANESTHESIA Monitor Anesthesia Care   HEMOSTASIS Pneumatic ankle tourniquet set at 250 mmHg  ESTIMATED BLOOD LOSS Minimal (<5 cc)  MATERIALS USED Synthes 2.4 mm screw x 1  INJECTABLES 0.5% Marcaine plain  PATHOLOGY None  COMPLICATIONS None  INDICATIONS: Chronic ulceration of the right foot nonresponsive to offloading and local wound care.  DESCRIPTION OF THE PROCEDURE:  The patient was brought to the operating room and placed on the operative table in the supine position.  A pneumatic ankle tourniquet was applied to the operative extremity.  Following sedation, the surgical site was anesthetized with 0.5% Marcaine plain.  The foot was then prepped, scrubbed, and draped in the usual sterile technique.  The foot was elevated, exsanguinated and the pneumatic ankle tourniquet inflated to 250 mmHg.    Attention was directed to the dorsal aspect of the third metatarsal.  A linear longitudinal incision was made overlying the distal third metatarsal.  Dissection was continued deep down to the level of the extensor digitorum longus tendon to the third toe.  The tendon was retracted medially.  A linear periosteal and capsular incision was performed.  The periosteal and capsular structures were reflected medially and laterally exposing the third metatarsal head.  A Weil osteotomy was performed and fixated with a 2.4 mm x 10 mm Synthes screw.   The surgical wound was irrigated with copious amounts of sterile irrigant.  The capsular structures were reapproximated using 4-0 Vicryl.  The subcutaneous structures were reapproximated using 4-0 Vicryl.  The skin was reapproximated using 4-0 Vicryl in a running subcuticular manner.  Wound closure was reinforced with Steri-Strips.  Attention was directed to the plantar aspect of the third metatarsal head.  A full-thickness ulceration was noted measuring 0.4 x 0.2 x 0.3 cm.  The periwound was hyperkeratotic.  The wound bed was comprised of mixed fibrotic and granular tissue.  Using a #15 blade a full-thickness excisional debridement was performed.  Skin and subcutaneous tissue including the fibrotic tissue was debrided.  Following debridement, the ulceration measured 0.6 x 0.3 x 0.4 cm.  The ulceration did not probe to bone.  A sterile compressive dressing was applied to the right foot.  The pneumatic ankle tourniquet was deflated and a prompt hyperemic response was noted to all digits of the right foot.  The patient tolerated the procedure well.  The patient was then transferred to PACU with vital signs stable and vascular status intact to all toes of the operative foot.

## 2019-12-03 NOTE — Discharge Instructions (Signed)

## 2019-12-03 NOTE — Brief Op Note (Signed)
BRIEF OPERATIVE NOTE  DATE OF PROCEDURE 12/03/2019  SURGEON Marcheta Grammes, DPM  ASSISTANT SURGEON Jilda Panda, DPM  OR STAFF Circulator: Cox, Gershon Mussel, RN Relief Circulator: Glory Rosebush, RN Scrub Person: Karin Lieu, [place] [employer] Surgical Tech: Angelena Form A   PREOPERATIVE DIAGNOSIS 1.  Chronic ulceration of the right foot 3.  Peripheral neuropathy   POSTOPERATIVE DIAGNOSIS Same  PROCEDURE 1.  Weil osteotomy of the 3rd metatarsal of the right foot 2.  Excisional debridement of skin and subcutaneous tissue of the right foot  ANESTHESIA Monitor Anesthesia Care   HEMOSTASIS Pneumatic ankle tourniquet set at 250 mmHg  ESTIMATED BLOOD LOSS Minimal (<5 cc)  MATERIALS USED Synthes 2.4 mm screw x 1  INJECTABLES 0.5% Marcaine plain  PATHOLOGY None  COMPLICATIONS None

## 2019-12-03 NOTE — Anesthesia Procedure Notes (Signed)
Procedure Name: MAC Date/Time: 12/03/2019 12:00 PM Performed by: Vista Deck, CRNA Pre-anesthesia Checklist: Patient identified, Emergency Drugs available, Suction available, Timeout performed and Patient being monitored Patient Re-evaluated:Patient Re-evaluated prior to induction Oxygen Delivery Method: Non-rebreather mask

## 2019-12-03 NOTE — Transfer of Care (Signed)
Immediate Anesthesia Transfer of Care Note  Patient: James Gillespie  Procedure(s) Performed: WEIL OSTEOTOMY 3rd METATARSAL FOOT (Right Foot) DEBRIDEMENT ULCERATION 3rd METATARSAL RIGHT FOOT (Right Foot)  Patient Location: PACU  Anesthesia Type:MAC  Level of Consciousness: awake, alert  and patient cooperative  Airway & Oxygen Therapy: Patient Spontanous Breathing  Post-op Assessment: Report given to RN and Post -op Vital signs reviewed and stable  Post vital signs: Reviewed and stable  Last Vitals:  Vitals Value Taken Time  BP    Temp 98.4   Pulse    Resp 18 12/03/19 1252  SpO2    Vitals shown include unvalidated device data.  Last Pain:  Vitals:   12/03/19 1046  TempSrc: Oral  PainSc: 0-No pain      Patients Stated Pain Goal: 8 (46/50/35 [id number])  Complications: No apparent anesthesia complications

## 2019-12-03 NOTE — Progress Notes (Signed)
Provided patient with post-op shoe for home.

## 2019-12-03 NOTE — H&P (Signed)
HISTORY AND PHYSICAL INTERVAL NOTE:  12/03/2019  11:18 AM  James Gillespie  has presented today for surgery, with the diagnosis of ulcer of the right foot.  The various methods of treatment have been discussed with the patient.  No guarantees were given.  After consideration of risks, benefits and other options for treatment, the patient has consented to surgery.  I have reviewed the patients' chart and labs.    Patient Vitals for the past 24 hrs:  BP Temp Temp src SpO2  12/03/19 1046 140/85 98 F (36.7 C) Oral 98 %    A history and physical examination was performed in my office.  The patient was reexamined.  There have been no changes to this history and physical examination.  Marcheta Grammes, DPM

## 2020-02-11 ENCOUNTER — Encounter (HOSPITAL_COMMUNITY)
Admission: RE | Admit: 2020-02-11 | Discharge: 2020-02-11 | Disposition: A | Discharge status: Home / Self Care | Payer: Medicare Other | Source: Ambulatory Visit | Attending: Podiatry | Admitting: Podiatry

## 2020-02-11 ENCOUNTER — Other Ambulatory Visit (HOSPITAL_COMMUNITY)
Admission: RE | Admit: 2020-02-11 | Discharge: 2020-02-11 | Disposition: A | Discharge status: Home / Self Care | Payer: Medicare Other | Source: Ambulatory Visit | Attending: Podiatry | Admitting: Podiatry

## 2020-02-11 ENCOUNTER — Encounter (HOSPITAL_COMMUNITY): Payer: Self-pay

## 2020-02-11 ENCOUNTER — Other Ambulatory Visit: Payer: Self-pay

## 2020-02-11 ENCOUNTER — Other Ambulatory Visit: Payer: Self-pay | Admitting: Podiatry

## 2020-02-11 DIAGNOSIS — Z20822 Contact with and (suspected) exposure to covid-19: Secondary | ICD-10-CM | POA: Diagnosis not present

## 2020-02-11 DIAGNOSIS — Z01812 Encounter for preprocedural laboratory examination: Secondary | ICD-10-CM | POA: Insufficient documentation

## 2020-02-11 LAB — C-REACTIVE PROTEIN: CRP: 5.7 mg/dL — ABNORMAL HIGH (ref <1.0)

## 2020-02-11 LAB — CBC
HCT: 42.9 % (ref 39.0–52.0)
Hemoglobin: 14.7 g/dL (ref 13.0–17.0)
MCH: 34.6 pg — ABNORMAL HIGH (ref 26.0–34.0)
MCHC: 34.3 g/dL (ref 30.0–36.0)
MCV: 100.9 fL — ABNORMAL HIGH (ref 80.0–100.0)
Platelets: 322 10*3/uL (ref 150–400)
RBC: 4.25 MIL/uL (ref 4.22–5.81)
RDW: 13.2 % (ref 11.5–15.5)
WBC: 10.9 10*3/uL — ABNORMAL HIGH (ref 4.0–10.5)
nRBC: 0 % (ref 0.0–0.2)

## 2020-02-11 LAB — DIFFERENTIAL
Abs Immature Granulocytes: 0.09 10*3/uL — ABNORMAL HIGH (ref 0.00–0.07)
Basophils Absolute: 0.1 10*3/uL (ref 0.0–0.1)
Basophils Relative: 1 %
Eosinophils Absolute: 0.2 10*3/uL (ref 0.0–0.5)
Eosinophils Relative: 2 %
Immature Granulocytes: 1 %
Lymphocytes Relative: 22 %
Lymphs Abs: 2.4 10*3/uL (ref 0.7–4.0)
Monocytes Absolute: 1.4 10*3/uL — ABNORMAL HIGH (ref 0.1–1.0)
Monocytes Relative: 12 %
Neutro Abs: 6.8 10*3/uL (ref 1.7–7.7)
Neutrophils Relative %: 62 %

## 2020-02-11 LAB — RESPIRATORY PANEL BY RT PCR (FLU A&B, COVID)
Influenza A by PCR: NEGATIVE
Influenza B by PCR: NEGATIVE
SARS Coronavirus 2 by RT PCR: NEGATIVE

## 2020-02-11 LAB — SEDIMENTATION RATE: Sed Rate: 47 mm/hr — ABNORMAL HIGH (ref 0–16)

## 2020-02-11 NOTE — Patient Instructions (Signed)
James Gillespie  02/11/2020     @PREFPERIOPPHARMACY @   Your procedure is scheduled on tomorrow, 02/12/20.  Report to [facility] at [phone] A.M.  Call this number if you have problems the morning of surgery:  [phone]   Remember:  Do not eat or drink after midnight.     Take these medicines the morning of surgery with A SIP OF WATER amlodipine, gabapentin    Do not wear jewelry, make-up or nail polish.  Do not wear lotions, powders, or perfumes, or deodorant.  Do not shave 48 hours prior to surgery.  Men may shave face and neck.  Do not bring valuables to the hospital.  [facility] is not responsible for any belongings or valuables.  Contacts, dentures or bridgework may not be worn into surgery.  Leave your suitcase in the car.  After surgery it may be brought to your room.  For patients admitted to the hospital, discharge time will be determined by your treatment team.  Patients discharged the day of surgery will not be allowed to drive home.   Name and phone number of your driver:   James Gillespie  Special instructions:  none  Please read over the following fact sheets that you were given. Anesthesia Post-op Instructions and Care and Recovery After Surgery     Wound Care, Adult Taking care of your wound properly can help to prevent pain, infection, and scarring. It can also help your wound to heal more quickly. How to care for your wound Wound care      Follow instructions from your health care provider about how to take care of your wound. Make sure you: ? Wash your hands with soap and water before you change the bandage (dressing). If soap and water are not available, use hand sanitizer. ? Change your dressing as told by your health care provider. ? Leave stitches (sutures), skin glue, or adhesive strips in place. These skin closures may need to stay in place for 2 weeks or longer. If adhesive strip edges start to loosen and curl up, you may trim the loose edges. Do  not remove adhesive strips completely unless your health care provider tells you to do that.  Check your wound area every day for signs of infection. Check for: ? Redness, swelling, or pain. ? Fluid or blood. ? Warmth. ? Pus or a bad smell.  Ask your health care provider if you should clean the wound with mild soap and water. Doing this may include: ? Using a clean towel to pat the wound dry after cleaning it. Do not rub or scrub the wound. ? Applying a cream or ointment. Do this only as told by your health care provider. ? Covering the incision with a clean dressing.  Ask your health care provider when you can leave the wound uncovered.  Keep the dressing dry until your health care provider says it can be removed. Do not take baths, swim, use a hot tub, or do anything that would put the wound underwater until your health care provider approves. Ask your health care provider if you can take showers. You may only be allowed to take sponge baths. Medicines   If you were prescribed an antibiotic medicine, cream, or ointment, take or use the antibiotic as told by your health care provider. Do not stop taking or using the antibiotic even if your condition improves.  Take over-the-counter and prescription medicines only as told by your health care provider. If you were  prescribed pain medicine, take it 30 or more minutes before you do any wound care or as told by your health care provider. General instructions  Return to your normal activities as told by your health care provider. Ask your health care provider what activities are safe.  Do not scratch or pick at the wound.  Do not use any products that contain nicotine or tobacco, such as cigarettes and e-cigarettes. These may delay wound healing. If you need help quitting, ask your health care provider.  Keep all follow-up visits as told by your health care provider. This is important.  Eat a diet that includes protein, vitamin A, vitamin  C, and other nutrient-rich foods to help the wound heal. ? Foods rich in protein include meat, dairy, beans, nuts, and other sources. ? Foods rich in vitamin A include carrots and dark green, leafy vegetables. ? Foods rich in vitamin C include citrus, tomatoes, and other fruits and vegetables. ? Nutrient-rich foods have protein, carbohydrates, fat, vitamins, or minerals. Eat a variety of healthy foods including vegetables, fruits, and whole grains. Contact a health care provider if:  You received a tetanus shot and you have swelling, severe pain, redness, or bleeding at the injection site.  Your pain is not controlled with medicine.  You have redness, swelling, or pain around the wound.  You have fluid or blood coming from the wound.  Your wound feels warm to the touch.  You have pus or a bad smell coming from the wound.  You have a fever or chills.  You are nauseous or you vomit.  You are dizzy. Get help right away if:  You have a red streak going away from your wound.  The edges of the wound open up and separate.  Your wound is bleeding, and the bleeding does not stop with gentle pressure.  You have a rash.  You faint.  You have trouble breathing. Summary  Always wash your hands with soap and water before changing your bandage (dressing).  To help with healing, eat foods that are rich in protein, vitamin A, vitamin C, and other nutrients.  Check your wound every day for signs of infection. Contact your health care provider if you suspect that your wound is infected. This information is not intended to replace advice given to you by your health care provider. Make sure you discuss any questions you have with your health care provider. Document Revised: 01/13/2019 Document Reviewed: 04/11/2016 Elsevier Patient Education  2020 [place] Anesthesia is a term that refers to techniques, procedures, and medicines that help a person stay safe  and comfortable during a medical procedure. Monitored anesthesia care, or sedation, is one type of anesthesia. Your anesthesia specialist may recommend sedation if you will be having a procedure that does not require you to be unconscious, such as:  Cataract surgery.  A dental procedure.  A biopsy.  A colonoscopy. During the procedure, you may receive a medicine to help you relax (sedative). There are three levels of sedation:  Mild sedation. At this level, you may feel awake and relaxed. You will be able to follow directions.  Moderate sedation. At this level, you will be sleepy. You may not remember the procedure.  Deep sedation. At this level, you will be asleep. You will not remember the procedure. The more medicine you are given, the deeper your level of sedation will be. Depending on how you respond to the procedure, the anesthesia specialist may change your  level of sedation or the type of anesthesia to fit your needs. An anesthesia specialist will monitor you closely during the procedure. Let your health care provider know about:  Any allergies you have.  All medicines you are taking, including vitamins, herbs, eye drops, creams, and over-the-counter medicines.  Any use of steroids (by mouth or as a cream).  Any problems you or family members have had with sedatives and anesthetic medicines.  Any blood disorders you have.  Any surgeries you have had.  Any medical conditions you have, such as sleep apnea.  Whether you are pregnant or may be pregnant.  Any use of cigarettes, alcohol, or street drugs. What are the risks? Generally, this is a safe procedure. However, problems may occur, including:  Getting too much medicine (oversedation).  Nausea.  Allergic reaction to medicines.  Trouble breathing. If this happens, a breathing tube may be used to help with breathing. It will be removed when you are awake and breathing on your own.  Heart trouble.  Lung  trouble. Before the procedure Staying hydrated Follow instructions from your health care provider about hydration, which may include:  Up to 2 hours before the procedure - you may continue to drink clear liquids, such as water, clear fruit juice, black coffee, and plain tea. Eating and drinking restrictions Follow instructions from your health care provider about eating and drinking, which may include:  8 hours before the procedure - stop eating heavy meals or foods such as meat, fried foods, or fatty foods.  6 hours before the procedure - stop eating light meals or foods, such as toast or cereal.  6 hours before the procedure - stop drinking milk or drinks that contain milk.  2 hours before the procedure - stop drinking clear liquids. Medicines Ask your health care provider about:  Changing or stopping your regular medicines. This is especially important if you are taking diabetes medicines or blood thinners.  Taking medicines such as aspirin and ibuprofen. These medicines can thin your blood. Do not take these medicines before your procedure if your health care provider instructs you not to. Tests and exams  You will have a physical exam.  You may have blood tests done to show: ? How well your kidneys and liver are working. ? How well your blood can clot. General instructions  Plan to have someone take you home from the hospital or clinic.  If you will be going home right after the procedure, plan to have someone with you for 24 hours.  What happens during the procedure?  Your blood pressure, heart rate, breathing, level of pain and overall condition will be monitored.  An IV tube will be inserted into one of your veins.  Your anesthesia specialist will give you medicines as needed to keep you comfortable during the procedure. This may mean changing the level of sedation.  The procedure will be performed. After the procedure  Your blood pressure, heart rate, breathing  rate, and blood oxygen level will be monitored until the medicines you were given have worn off.  Do not drive for 24 hours if you received a sedative.  You may: ? Feel sleepy, clumsy, or nauseous. ? Feel forgetful about what happened after the procedure. ? Have a sore throat if you had a breathing tube during the procedure. ? Vomit. This information is not intended to replace advice given to you by your health care provider. Make sure you discuss any questions you have with your health care  provider. Document Revised: 09/07/2017 Document Reviewed: 01/16/2016 Elsevier Patient Education  2020 [place] Anesthesia is a term that refers to techniques, procedures, and medicines that help a person stay safe and comfortable during a medical procedure. Monitored anesthesia care, or sedation, is one type of anesthesia. Your anesthesia specialist may recommend sedation if you will be having a procedure that does not require you to be unconscious, such as:  Cataract surgery.  A dental procedure.  A biopsy.  A colonoscopy. During the procedure, you may receive a medicine to help you relax (sedative). There are three levels of sedation:  Mild sedation. At this level, you may feel awake and relaxed. You will be able to follow directions.  Moderate sedation. At this level, you will be sleepy. You may not remember the procedure.  Deep sedation. At this level, you will be asleep. You will not remember the procedure. The more medicine you are given, the deeper your level of sedation will be. Depending on how you respond to the procedure, the anesthesia specialist may change your level of sedation or the type of anesthesia to fit your needs. An anesthesia specialist will monitor you closely during the procedure. Let your health care provider know about:  Any allergies you have.  All medicines you are taking, including vitamins, herbs, eye drops, creams, and  over-the-counter medicines.  Any use of steroids (by mouth or as a cream).  Any problems you or family members have had with sedatives and anesthetic medicines.  Any blood disorders you have.  Any surgeries you have had.  Any medical conditions you have, such as sleep apnea.  Whether you are pregnant or may be pregnant.  Any use of cigarettes, alcohol, or street drugs. What are the risks? Generally, this is a safe procedure. However, problems may occur, including:  Getting too much medicine (oversedation).  Nausea.  Allergic reaction to medicines.  Trouble breathing. If this happens, a breathing tube may be used to help with breathing. It will be removed when you are awake and breathing on your own.  Heart trouble.  Lung trouble. Before the procedure Staying hydrated Follow instructions from your health care provider about hydration, which may include:  Up to 2 hours before the procedure - you may continue to drink clear liquids, such as water, clear fruit juice, black coffee, and plain tea. Eating and drinking restrictions Follow instructions from your health care provider about eating and drinking, which may include:  8 hours before the procedure - stop eating heavy meals or foods such as meat, fried foods, or fatty foods.  6 hours before the procedure - stop eating light meals or foods, such as toast or cereal.  6 hours before the procedure - stop drinking milk or drinks that contain milk.  2 hours before the procedure - stop drinking clear liquids. Medicines Ask your health care provider about:  Changing or stopping your regular medicines. This is especially important if you are taking diabetes medicines or blood thinners.  Taking medicines such as aspirin and ibuprofen. These medicines can thin your blood. Do not take these medicines before your procedure if your health care provider instructs you not to. Tests and exams  You will have a physical exam.  You  may have blood tests done to show: ? How well your kidneys and liver are working. ? How well your blood can clot. General instructions  Plan to have someone take you home from the hospital or clinic.  If you will be going home right after the procedure, plan to have someone with you for 24 hours.  What happens during the procedure?  Your blood pressure, heart rate, breathing, level of pain and overall condition will be monitored.  An IV tube will be inserted into one of your veins.  Your anesthesia specialist will give you medicines as needed to keep you comfortable during the procedure. This may mean changing the level of sedation.  The procedure will be performed. After the procedure  Your blood pressure, heart rate, breathing rate, and blood oxygen level will be monitored until the medicines you were given have worn off.  Do not drive for 24 hours if you received a sedative.  You may: ? Feel sleepy, clumsy, or nauseous. ? Feel forgetful about what happened after the procedure. ? Have a sore throat if you had a breathing tube during the procedure. ? Vomit. This information is not intended to replace advice given to you by your health care provider. Make sure you discuss any questions you have with your health care provider. Document Revised: 09/07/2017 Document Reviewed: 01/16/2016 Elsevier Patient Education  [place].

## 2020-02-12 ENCOUNTER — Ambulatory Visit (HOSPITAL_COMMUNITY)
Admission: RE | Admit: 2020-02-12 | Discharge: 2020-02-12 | Disposition: A | Discharge status: Home / Self Care | Payer: Medicare Other | Attending: Podiatry | Admitting: Podiatry

## 2020-02-12 ENCOUNTER — Ambulatory Visit (HOSPITAL_COMMUNITY): Payer: Medicare Other

## 2020-02-12 ENCOUNTER — Encounter (HOSPITAL_COMMUNITY): Payer: Self-pay | Admitting: Podiatry

## 2020-02-12 ENCOUNTER — Ambulatory Visit (HOSPITAL_COMMUNITY): Payer: Medicare Other | Admitting: Anesthesiology

## 2020-02-12 ENCOUNTER — Encounter (HOSPITAL_COMMUNITY)
Admission: RE | Disposition: A | Discharge status: Home / Self Care | Payer: Self-pay | Source: Home / Self Care | Attending: Podiatry

## 2020-02-12 DIAGNOSIS — G629 Polyneuropathy, unspecified: Secondary | ICD-10-CM | POA: Diagnosis not present

## 2020-02-12 DIAGNOSIS — Z9889 Other specified postprocedural states: Secondary | ICD-10-CM

## 2020-02-12 DIAGNOSIS — L03031 Cellulitis of right toe: Secondary | ICD-10-CM | POA: Diagnosis not present

## 2020-02-12 DIAGNOSIS — M869 Osteomyelitis, unspecified: Secondary | ICD-10-CM | POA: Diagnosis not present

## 2020-02-12 DIAGNOSIS — L97519 Non-pressure chronic ulcer of other part of right foot with unspecified severity: Secondary | ICD-10-CM | POA: Insufficient documentation

## 2020-02-12 DIAGNOSIS — I10 Essential (primary) hypertension: Secondary | ICD-10-CM | POA: Diagnosis not present

## 2020-02-12 HISTORY — PX: AMPUTATION: SHX166

## 2020-02-12 LAB — GLUCOSE, CAPILLARY: Glucose-Capillary: 129 mg/dL — ABNORMAL HIGH (ref 70–99)

## 2020-02-12 SURGERY — AMPUTATION DIGIT
Anesthesia: General | Site: Foot | Laterality: Right

## 2020-02-12 MED ORDER — CHLORHEXIDINE GLUCONATE CLOTH 2 % EX PADS: 6.0000 | MEDICATED_PAD | Freq: Once | CUTANEOUS | Status: DC

## 2020-02-12 MED ORDER — VANCOMYCIN HCL 2000 MG/400ML IV SOLN: 2000.0000 mg | Freq: Once | INTRAVENOUS | Status: DC

## 2020-02-12 MED ORDER — ORAL CARE MOUTH RINSE: 15.0000 mL | Freq: Once | OROMUCOSAL | Status: DC

## 2020-02-12 MED ORDER — CHLORHEXIDINE GLUCONATE 0.12 % MT SOLN: 15.0000 mL | Freq: Once | OROMUCOSAL | Status: DC

## 2020-02-12 MED ORDER — PROPOFOL 10 MG/ML IV BOLUS
INTRAVENOUS | Status: AC
  Filled 2020-02-12: qty 40

## 2020-02-12 MED ORDER — BUPIVACAINE HCL (PF) 0.5 % IJ SOLN
INTRAMUSCULAR | Status: AC
  Filled 2020-02-12: qty 30

## 2020-02-12 MED ORDER — MIDAZOLAM HCL 5 MG/5ML IJ SOLN
INTRAMUSCULAR | Status: DC | PRN
  Administered 2020-02-12: 2 mg via INTRAVENOUS

## 2020-02-12 MED ORDER — MIDAZOLAM HCL 2 MG/2ML IJ SOLN
INTRAMUSCULAR | Status: AC
  Filled 2020-02-12: qty 2

## 2020-02-12 MED ORDER — 0.9 % SODIUM CHLORIDE (POUR BTL) OPTIME
TOPICAL | Status: DC | PRN
  Administered 2020-02-12: 1000 mL

## 2020-02-12 MED ORDER — LACTATED RINGERS IV SOLN: INTRAVENOUS | Status: DC

## 2020-02-12 MED ORDER — BUPIVACAINE HCL (PF) 0.5 % IJ SOLN
INTRAMUSCULAR | Status: DC | PRN
  Administered 2020-02-12: 10 mL

## 2020-02-12 MED ORDER — VANCOMYCIN HCL 1500 MG/300ML IV SOLN
1500.0000 mg | INTRAVENOUS | Status: DC
  Filled 2020-02-12: qty 300

## 2020-02-12 MED ORDER — FENTANYL CITRATE (PF) 100 MCG/2ML IJ SOLN
INTRAMUSCULAR | Status: AC
  Filled 2020-02-12: qty 2

## 2020-02-12 MED ORDER — PROPOFOL 10 MG/ML IV BOLUS
INTRAVENOUS | Status: DC | PRN
  Administered 2020-02-12: 50 mg via INTRAVENOUS

## 2020-02-12 MED ORDER — PROPOFOL 500 MG/50ML IV EMUL
INTRAVENOUS | Status: DC | PRN
  Administered 2020-02-12: 150 ug/kg/min via INTRAVENOUS

## 2020-02-12 MED ORDER — FENTANYL CITRATE (PF) 100 MCG/2ML IJ SOLN: 25.0000 ug | INTRAMUSCULAR | Status: DC | PRN

## 2020-02-12 SURGICAL SUPPLY — 39 items
BANDAGE ELASTIC 3 LF NS (GAUZE/BANDAGES/DRESSINGS) ×3 IMPLANT
BANDAGE ESMARK 4X12 BL STRL LF (DISPOSABLE) ×1 IMPLANT
BENZOIN TINCTURE PRP APPL 2/3 (GAUZE/BANDAGES/DRESSINGS) IMPLANT
BLADE SURG 15 STRL LF DISP TIS (BLADE) ×1 IMPLANT
BLADE SURG 15 STRL SS (BLADE) ×2
BNDG CONFORM 2 STRL LF (GAUZE/BANDAGES/DRESSINGS) ×3 IMPLANT
BNDG ELASTIC 4X5.8 VLCR NS LF (GAUZE/BANDAGES/DRESSINGS) ×3 IMPLANT
BNDG ESMARK 4X12 BLUE STRL LF (DISPOSABLE) ×3
BNDG GAUZE ELAST 4 BULKY (GAUZE/BANDAGES/DRESSINGS) ×3 IMPLANT
CLOSURE WOUND 1/2 X4 (GAUZE/BANDAGES/DRESSINGS)
CLOTH BEACON ORANGE TIMEOUT ST (SAFETY) ×3 IMPLANT
COVER LIGHT HANDLE STERIS (MISCELLANEOUS) ×6 IMPLANT
COVER WAND RF STERILE (DRAPES) ×3 IMPLANT
CUFF TOURN SGL QUICK 18X4 (TOURNIQUET CUFF) ×3 IMPLANT
DECANTER SPIKE VIAL GLASS SM (MISCELLANEOUS) ×3 IMPLANT
DRSG ADAPTIC 3X8 NADH LF (GAUZE/BANDAGES/DRESSINGS) ×3 IMPLANT
ELECT REM PT RETURN 9FT ADLT (ELECTROSURGICAL) ×3
ELECTRODE REM PT RTRN 9FT ADLT (ELECTROSURGICAL) ×1 IMPLANT
GAUZE SPONGE 4X4 12PLY STRL (GAUZE/BANDAGES/DRESSINGS) ×3 IMPLANT
GLOVE BIO SURGEON STRL SZ7.5 (GLOVE) ×3 IMPLANT
GLOVE BIOGEL PI IND STRL 7.0 (GLOVE) ×2 IMPLANT
GLOVE BIOGEL PI INDICATOR 7.0 (GLOVE) ×4
GLOVE ECLIPSE 6.5 STRL STRAW (GLOVE) ×3 IMPLANT
GOWN STRL REUS W/TWL LRG LVL3 (GOWN DISPOSABLE) ×6 IMPLANT
KIT TURNOVER KIT A (KITS) ×3 IMPLANT
MANIFOLD NEPTUNE II (INSTRUMENTS) ×3 IMPLANT
NEEDLE HYPO 27GX1-1/4 (NEEDLE) ×6 IMPLANT
NS IRRIG 1000ML POUR BTL (IV SOLUTION) ×3 IMPLANT
PACK BASIC LIMB (CUSTOM PROCEDURE TRAY) ×3 IMPLANT
PAD ARMBOARD 7.5X6 YLW CONV (MISCELLANEOUS) ×3 IMPLANT
SET BASIN LINEN APH (SET/KITS/TRAYS/PACK) ×3 IMPLANT
SOL PREP PROV IODINE SCRUB 4OZ (MISCELLANEOUS) ×3 IMPLANT
STRIP CLOSURE SKIN 1/2X4 (GAUZE/BANDAGES/DRESSINGS) IMPLANT
SUT ETHILON 3 0 PS 1 (SUTURE) ×3 IMPLANT
SUT ETHILON 4 0 PS 2 18 (SUTURE) ×3 IMPLANT
SUT PROLENE 4 0 PS 2 18 (SUTURE) IMPLANT
SUT VIC AB 4-0 PS2 27 (SUTURE) IMPLANT
SUT VICRYL AB 3-0 FS1 BRD 27IN (SUTURE) ×3 IMPLANT
SYR CONTROL 10ML LL (SYRINGE) ×6 IMPLANT

## 2020-02-12 NOTE — Op Note (Signed)
OPERATIVE NOTE  DATE OF PROCEDURE 02/12/2020  SURGEON Marcheta Grammes, DPM  ASSISTANT SURGEON None  OR STAFF Circulator: Cox, Gershon Mussel, RN Scrub Person: Lucie Leather, CST Circulator Assistant: Glory Rosebush, RN   PREOPERATIVE DIAGNOSIS 1.  Cellulitis, second toe right foot 2.  Osteomyelitis, second toe right foot 3.  Ulceration, second toe right foot 4.  Peripheral neuropathy  POSTOPERATIVE DIAGNOSIS Same  PROCEDURE Partial amputation of the right second toe  ANESTHESIA Monitor Anesthesia Care   HEMOSTASIS Pneumatic ankle tourniquet set at 250 mmHg  ESTIMATED BLOOD LOSS Minimal (<5 cc)  MATERIALS USED None  INJECTABLES 0.5% Marcaine plain  PATHOLOGY Distal aspect of the right second toe  COMPLICATIONS None  INDICATIONS: The patient presented to my office on 02/06/2020 complaining of a new wound of his right second toe which probes to bone.  Initial radiographs did not reveal erosive changes to suggest osteomyelitis.  The ulceration was debrided and a culture was obtained.  He was placed on doxycycline.  He returned as scheduled on 02/10/2020 without clinical improvement.  Radiographs were repeated and showed erosive changes of the distal phalanx suggestive of osteomyelitis.  A partial amputation of the right second toe was recommended.  The benefits and risk of the procedure were explained and informed consent was obtained.  DESCRIPTION OF THE PROCEDURE: Patient was brought to the operating room and placed on the operative table in the supine position.  A pneumatic ankle tourniquet was placed about the patient's right ankle.  A timeout was performed.  The right foot was anesthetized using 0.5% Marcaine plain.  The foot was scrubbed prepped and draped in the usual sterile manner.  A second timeout was performed.  The foot was elevated and the pneumatic ankle tourniquet inflated to 250 mmHg.  Attention was directed to the distal aspect of the right second  toe where a fishmouth incision was made overlying the distal interphalangeal joint.  Dissection was continued deep down to the level of the distal interphalangeal joint.  The joint was disarticulated.  The distal aspect of the toe was passed from the operative field and sent to pathology for evaluation.  The head of the middle phalanx was inspected and found to be free of any erosive changes.  The head of the middle phalanx was resected using a bone cutter to allow for adequate soft tissue closure.  The bone was smoothed with a hand rasp.  The surgical wound was irrigated with copious amounts of sterile irrigant.  The skin was reapproximated using 4-0 nylon.  A sterile compressive dressing was applied to the right foot.  The pneumatic ankle tourniquet was deflated and a prompt hyperemic response was noted to all digits of the right foot.  The patient tolerated the procedure well.  He was transferred from the operating room to the PACU with vital signs stable.

## 2020-02-12 NOTE — Transfer of Care (Signed)
Immediate Anesthesia Transfer of Care Note  Patient: James Gillespie  Procedure(s) Performed: PARTIAL AMPUTATION DIGIT SECOND TOE RIGHT FOOT (Right Foot)  Patient Location: PACU  Anesthesia Type:General  Level of Consciousness: awake  Airway & Oxygen Therapy: Patient Spontanous Breathing  Post-op Assessment: Report given to RN  Post vital signs: Reviewed  Last Vitals:  Vitals Value Taken Time  BP 134/75 02/12/20 0827  Temp    Pulse 69 02/12/20 0828  Resp 22 02/12/20 0828  SpO2 99 % 02/12/20 0828  Vitals shown include unvalidated device data.  Last Pain:  Vitals:   02/12/20 0701  TempSrc: Oral  PainSc: 0-No pain         Complications: No apparent anesthesia complications

## 2020-02-12 NOTE — Anesthesia Preprocedure Evaluation (Signed)
Anesthesia Evaluation  Patient identified by MRN, date of birth, ID band Patient awake    Reviewed: Allergy & Precautions, H&P , NPO status , Patient's Chart, lab work & pertinent test results, reviewed documented beta blocker date and time   Airway Mallampati: II  TM Distance: >3 FB Neck ROM: full    Dental no notable dental hx. (+) Teeth Intact   Pulmonary neg pulmonary ROS,    Pulmonary exam normal breath sounds clear to auscultation       Cardiovascular Exercise Tolerance: Good hypertension, negative cardio ROS   Rhythm:regular Rate:Normal     Neuro/Psych negative neurological ROS  negative psych ROS   GI/Hepatic negative GI ROS, Neg liver ROS,   Endo/Other  negative endocrine ROS  Renal/GU negative Renal ROS  negative genitourinary   Musculoskeletal   Abdominal   Peds  Hematology negative hematology ROS (+)   Anesthesia Other Findings   Reproductive/Obstetrics negative OB ROS                             Anesthesia Physical Anesthesia Plan  ASA: II  Anesthesia Plan: General   Post-op Pain Management:    Induction:   PONV Risk Score and Plan: 2  Airway Management Planned:   Additional Equipment:   Intra-op Plan:   Post-operative Plan:   Informed Consent: I have reviewed the patients History and Physical, chart, labs and discussed the procedure including the risks, benefits and alternatives for the proposed anesthesia with the patient or authorized representative who has indicated his/her understanding and acceptance.     Dental Advisory Given  Plan Discussed with: CRNA  Anesthesia Plan Comments:         Anesthesia Quick Evaluation

## 2020-02-12 NOTE — Brief Op Note (Signed)
BRIEF OPERATIVE NOTE  DATE OF PROCEDURE 02/12/2020  SURGEON Marcheta Grammes, DPM  ASSISTANT SURGEON None  OR STAFF Circulator: Cox, Gershon Mussel, RN Scrub Person: Lucie Leather, CST Circulator Assistant: Glory Rosebush, RN   PREOPERATIVE DIAGNOSIS 1.  Cellulitis, second toe right foot 2.  Osteomyelitis, second toe right foot 3.  Ulceration, second toe right foot 4.  Peripheral neuropathy  POSTOPERATIVE DIAGNOSIS Same  PROCEDURE Partial amputation of the right second toe  ANESTHESIA Monitor Anesthesia Care   HEMOSTASIS Pneumatic ankle tourniquet set at 250 mmHg  ESTIMATED BLOOD LOSS Minimal (<5 cc)  MATERIALS USED None  INJECTABLES 0.5% Marcaine plain  PATHOLOGY Distal aspect of the right second toe  COMPLICATIONS None

## 2020-02-12 NOTE — H&P (Signed)
HISTORY AND PHYSICAL INTERVAL NOTE:  02/12/2020  7:29 AM  James Gillespie  has presented today for surgery, with the diagnosis of cellulitis of the right second toe.  The various methods of treatment have been discussed with the patient.  No guarantees were given.  After consideration of risks, benefits and other options for treatment, the patient has consented to surgery.  I have reviewed the patients' chart and labs.    Patient Vitals for the past 24 hrs:  BP Temp Temp src Pulse Resp SpO2  02/12/20 0701 (!) 149/83 98.1 F (36.7 C) Oral 81 20 97 %    A history and physical examination was performed in my office.  The patient was reexamined.  There have been no changes to this history and physical examination.  Marcheta Grammes, DPM

## 2020-02-12 NOTE — Anesthesia Postprocedure Evaluation (Signed)
Anesthesia Post Note  Patient: James Gillespie  Procedure(s) Performed: PARTIAL AMPUTATION DIGIT SECOND TOE RIGHT FOOT (Right Foot)  Anesthesia Type: General     Last Vitals:  Vitals:   02/12/20 0845 02/12/20 0917  BP:  140/73  Pulse: 67 64  Resp: 17 16  Temp:  36.6 C  SpO2: 98% 96%    Last Pain:  Vitals:   02/12/20 0917  TempSrc:   PainSc: 0-No pain                 Louann Sjogren

## 2020-02-12 NOTE — Discharge Instructions (Signed)
These instructions will give you an idea of what to expect after surgery and how to manage issues that may arise before your first post op office visit.  Pain Management Pain is best managed by "staying ahead" of it. If pain gets out of control, it is difficult to get it back under control. Local anesthesia that lasts 6-8 hours is used to numb the foot and decrease pain.  For the best pain control, take the pain medication every 4 hours for the first 2 days post op. On the third day pain medication can be taken as needed.   Post Op Nausea Nausea is common after surgery, so it is managed proactively.  If prescribed, use the prescribed nausea medication regularly for the first 2 days post op.  Bandages Do not worry if there is blood on the bandage. What looks like a lot of blood on the bandage is actually a small amount. Blood on the dressing spreads out as it is absorbed by the gauze, the same way a drop of water spreads out on a paper towel.  If the bandages feel wet or dry, stiff and uncomfortable, call the office during office hours and we will schedule a time for you to have the bandage changed.  Unless you are specifically told otherwise, we will do the first bandage change in the office.  Keep your bandage dry. If the bandage becomes wet or soiled, notify the office and we will schedule a time to change the bandage.  Activity It is best to spend most of the first 2 days after surgery lying down with the foot elevated above the level of your heart. You may put weight on your heel while wearing the CAM Walker (black boot).   You may only get up to go to the restroom.  Driving Do not drive until you are able to respond in an emergency (i.e. slam on the brakes). This usually occurs after the bone has healed - 6 to 8 weeks.  Call the Office If you have a fever over 101F.  If you have increasing pain after the initial post op pain has settled down.  If you have increasing redness, swelling,  or drainage.  If you have any questions or concerns.   

## 2020-02-13 LAB — SURGICAL PATHOLOGY

## 2020-04-30 ENCOUNTER — Other Ambulatory Visit: Payer: Self-pay | Admitting: Podiatry

## 2020-05-10 NOTE — Patient Instructions (Signed)
James Gillespie  05/10/2020     @PREFPERIOPPHARMACY @   Your procedure is scheduled on  05/12/2020.  Report to [facility] at  1215  P.M.  Call this number if you have problems the morning of surgery:  [phone]   Remember:  Do not eat or drink after midnight.                      Take these medicines the morning of surgery with A SIP OF WATER  Amlodipine and clonidine.    Do not wear jewelry, make-up or nail polish.  Do not wear lotions, powders, or perfumes. Please wear deodorant and brush your teeth.  Do not shave 48 hours prior to surgery.  Men may shave face and neck.  Do not bring valuables to the hospital.  [facility] is not responsible for any belongings or valuables.  Contacts, dentures or bridgework may not be worn into surgery.  Leave your suitcase in the car.  After surgery it may be brought to your room.  For patients admitted to the hospital, discharge time will be determined by your treatment team.  Patients discharged the day of surgery will not be allowed to drive home.   Name and phone number of your driver:   family Special instructions:  DO NOT smoke the morning of your procedure.  Please read over the following fact sheets that you were given. Anesthesia Post-op Instructions and Care and Recovery After Surgery       Metatarsal Osteotomy, Care After This sheet gives you information about how to care for yourself after your procedure. Your health care provider may also give you more specific instructions. If you have problems or questions, contact your health care provider. What can I expect after the procedure? After the procedure, it is common to have:  Soreness.  Pain.  Stiffness.  Swelling. Follow these instructions at home: Medicines  Take over-the-counter and prescription medicines only as told by your health care provider.  Ask your health care provider if the medicine prescribed to you can cause constipation. You may need to  take steps to prevent or treat constipation, such as: ? Drink enough fluid to keep your urine pale yellow. ? Take over-the-counter or prescription medicines. ? Eat foods that are high in fiber, such as beans, whole grains, and fresh fruits and vegetables. ? Limit foods that are high in fat and processed sugars, such as fried or sweet foods. If you have a splint or walking boot:  Wear it as told by your health care provider. Remove it only as told by your health care provider.  Loosen it if your toes tingle, become numb, or turn cold and blue.  Keep it clean.  If it is not waterproof: ? Do not let it get wet. ? Cover it with a watertight covering when you take a bath or shower. Bathing  Do not take baths, swim, or use a hot tub until your health care provider approves. Ask your health care provider if you may take showers. You may only be allowed to take sponge baths.  Keep the bandage (dressing) dry. Incision care   Follow instructions from your health care provider about how to take care of your incision. Make sure you: ? Wash your hands with soap and water before you change your bandage (dressing). If soap and water are not available, use hand sanitizer. ? Change your dressing as told by your  health care provider. ? Leave stitches (sutures), skin glue, or adhesive strips in place. These skin closures may need to stay in place for 2 weeks or longer. If adhesive strip edges start to loosen and curl up, you may trim the loose edges. Do not remove adhesive strips completely unless your health care provider tells you to do that.  Check your incision area every day for signs of infection. Check for: ? More redness, swelling, or pain. ? Fluid or blood. ? Warmth. ? Pus or a bad smell. Managing pain, stiffness, and swelling   If directed, put ice on the injured area. ? If you have a removable splint, remove it as told by your health care provider. ? Put ice in a plastic bag. ? Place  a towel between your skin and the bag. ? Leave the ice on for 20 minutes, 2-3 times a day.  Move your toes often to avoid stiffness and to lessen swelling.  Raise (elevate) the injured area above the level of your heart while you are sitting or lying down. Driving  Do not drive or use heavy machinery while taking prescription pain medicine.  Do not drive for 24 hours if you were given a sedative during your procedure.  Ask your health care provider when it is safe to drive if you have a dressing, splint, special shoe, or walking boot on your foot. General instructions  Do not remove the bandage (dressing) around your foot until directed by your health care provider.  If you were given a splint, special shoe, or walking boot, wear it as told by your health care provider.  Do not use the injured limb to support your body weight until your health care provider says that you can. Use crutches or a walker as told by your health care provider.  Return to your normal activities as told by your health care provider. Ask your health care provider what activities are safe for you.  Do not use any products that contain nicotine or tobacco, such as cigarettes, e-cigarettes, and chewing tobacco. These can delay bone healing. If you need help quitting, ask your health care provider.  Keep all follow-up visits as told by your health care provider. This is important. Contact a health care provider if:  You have a fever.  Your dressing becomes wet, loose, or stained with blood or discharge.  You have pus or a bad smell coming from your incision or bandage.  Your foot becomes red, swollen, or tender.  You have pain or stiffness that does not get better or gets worse.  You have tingling or numbness in your foot that does not get better or gets worse. Get help right away if:  You develop a warm and tender swelling in your leg.  You have chest pain.  You have trouble  breathing. Summary  After the procedure, it is common to have soreness, pain, stiffness, and swelling.  Follow instructions on caring for your incision, changing your dressing, and using weight support to protect your foot.  Contact your health care provider if you have a fever, pus or a bad smell coming from your wound or dressing, or pain and stiffness do not get better.  Get help right away if you develop a warm and tender swelling in your leg, have chest pain, or trouble breathing. This information is not intended to replace advice given to you by your health care provider. Make sure you discuss any questions you have with your health  care provider. Document Revised: 08/09/2018 Document Reviewed: 08/09/2018 Elsevier Patient Education  2020 [place] After These instructions provide you with information about caring for yourself after your procedure. Your health care provider may also give you more specific instructions. Your treatment has been planned according to current medical practices, but problems sometimes occur. Call your health care provider if you have any problems or questions after your procedure. What can I expect after the procedure? After your procedure, you may:  Feel sleepy for several hours.  Feel clumsy and have poor balance for several hours.  Feel forgetful about what happened after the procedure.  Have poor judgment for several hours.  Feel nauseous or vomit.  Have a sore throat if you had a breathing tube during the procedure. Follow these instructions at home: For at least 24 hours after the procedure:      Have a responsible adult stay with you. It is important to have someone help care for you until you are awake and alert.  Rest as needed.  Do not: ? Participate in activities in which you could fall or become injured. ? Drive. ? Use heavy machinery. ? Drink alcohol. ? Take sleeping pills or medicines  that cause drowsiness. ? Make important decisions or sign legal documents. ? Take care of children on your own. Eating and drinking  Follow the diet that is recommended by your health care provider.  If you vomit, drink water, juice, or soup when you can drink without vomiting.  Make sure you have little or no nausea before eating solid foods. General instructions  Take over-the-counter and prescription medicines only as told by your health care provider.  If you have sleep apnea, surgery and certain medicines can increase your risk for breathing problems. Follow instructions from your health care provider about wearing your sleep device: ? Anytime you are sleeping, including during daytime naps. ? While taking prescription pain medicines, sleeping medicines, or medicines that make you drowsy.  If you smoke, do not smoke without supervision.  Keep all follow-up visits as told by your health care provider. This is important. Contact a health care provider if:  You keep feeling nauseous or you keep vomiting.  You feel light-headed.  You develop a rash.  You have a fever. Get help right away if:  You have trouble breathing. Summary  For several hours after your procedure, you may feel sleepy and have poor judgment.  Have a responsible adult stay with you for at least 24 hours or until you are awake and alert. This information is not intended to replace advice given to you by your health care provider. Make sure you discuss any questions you have with your health care provider. Document Revised: 12/24/2017 Document Reviewed: 01/16/2016 Elsevier Patient Education  [place]. How to Use Chlorhexidine for Bathing Chlorhexidine gluconate (CHG) is a germ-killing (antiseptic) solution that is used to clean the skin. It can get rid of the bacteria that normally live on the skin and can keep them away for about 24 hours. To clean your skin with CHG, you may be given:  A CHG  solution to use in the shower or as part of a sponge bath.  A prepackaged cloth that contains CHG. Cleaning your skin with CHG may help lower the risk for infection:  While you are staying in the intensive care unit of the hospital.  If you have a vascular access, such as a central line, to provide  short-term or long-term access to your veins.  If you have a catheter to drain urine from your bladder.  If you are on a ventilator. A ventilator is a machine that helps you breathe by moving air in and out of your lungs.  After surgery. What are the risks? Risks of using CHG include:  A skin reaction.  Hearing loss, if CHG gets in your ears.  Eye injury, if CHG gets in your eyes and is not rinsed out.  The CHG product catching fire. Make sure that you avoid smoking and flames after applying CHG to your skin. Do not use CHG:  If you have a chlorhexidine allergy or have previously reacted to chlorhexidine.  On babies younger than 77 months of age. How to use CHG solution  Use CHG only as told by your health care provider, and follow the instructions on the label.  Use the full amount of CHG as directed. Usually, this is one bottle. During a shower Follow these steps when using CHG solution during a shower (unless your health care provider gives you different instructions): 1. Start the shower. 2. Use your normal soap and shampoo to wash your face and hair. 3. Turn off the shower or move out of the shower stream. 4. Pour the CHG onto a clean washcloth. Do not use any type of brush or rough-edged sponge. 5. Starting at your neck, lather your body down to your toes. Make sure you follow these instructions: ? If you will be having surgery, pay special attention to the part of your body where you will be having surgery. Scrub this area for at least 1 minute. ? Do not use CHG on your head or face. If the solution gets into your ears or eyes, rinse them well with water. ? Avoid your  genital area. ? Avoid any areas of skin that have broken skin, cuts, or scrapes. ? Scrub your back and under your arms. Make sure to wash skin folds. 6. Let the lather sit on your skin for 1-2 minutes or as long as told by your health care provider. 7. Thoroughly rinse your entire body in the shower. Make sure that all body creases and crevices are rinsed well. 8. Dry off with a clean towel. Do not put any substances on your body afterward--such as powder, lotion, or perfume--unless you are told to do so by your health care provider. Only use lotions that are recommended by the manufacturer. 9. Put on clean clothes or pajamas. 10. If it is the night before your surgery, sleep in clean sheets.  During a sponge bath Follow these steps when using CHG solution during a sponge bath (unless your health care provider gives you different instructions): 1. Use your normal soap and shampoo to wash your face and hair. 2. Pour the CHG onto a clean washcloth. 3. Starting at your neck, lather your body down to your toes. Make sure you follow these instructions: ? If you will be having surgery, pay special attention to the part of your body where you will be having surgery. Scrub this area for at least 1 minute. ? Do not use CHG on your head or face. If the solution gets into your ears or eyes, rinse them well with water. ? Avoid your genital area. ? Avoid any areas of skin that have broken skin, cuts, or scrapes. ? Scrub your back and under your arms. Make sure to wash skin folds. 4. Let the lather sit on your skin  for 1-2 minutes or as long as told by your health care provider. 5. Using a different clean, wet washcloth, thoroughly rinse your entire body. Make sure that all body creases and crevices are rinsed well. 6. Dry off with a clean towel. Do not put any substances on your body afterward--such as powder, lotion, or perfume--unless you are told to do so by your health care provider. Only use lotions  that are recommended by the manufacturer. 7. Put on clean clothes or pajamas. 8. If it is the night before your surgery, sleep in clean sheets. How to use CHG prepackaged cloths  Only use CHG cloths as told by your health care provider, and follow the instructions on the label.  Use the CHG cloth on clean, dry skin.  Do not use the CHG cloth on your head or face unless your health care provider tells you to.  When washing with the CHG cloth: ? Avoid your genital area. ? Avoid any areas of skin that have broken skin, cuts, or scrapes. Before surgery Follow these steps when using a CHG cloth to clean before surgery (unless your health care provider gives you different instructions): 1. Using the CHG cloth, vigorously scrub the part of your body where you will be having surgery. Scrub using a back-and-forth motion for 3 minutes. The area on your body should be completely wet with CHG when you are done scrubbing. 2. Do not rinse. Discard the cloth and let the area air-dry. Do not put any substances on the area afterward, such as powder, lotion, or perfume. 3. Put on clean clothes or pajamas. 4. If it is the night before your surgery, sleep in clean sheets.  For general bathing Follow these steps when using CHG cloths for general bathing (unless your health care provider gives you different instructions). 1. Use a separate CHG cloth for each area of your body. Make sure you wash between any folds of skin and between your fingers and toes. Wash your body in the following order, switching to a new cloth after each step: ? The front of your neck, shoulders, and chest. ? Both of your arms, under your arms, and your hands. ? Your stomach and groin area, avoiding the genitals. ? Your right leg and foot. ? Your left leg and foot. ? The back of your neck, your back, and your buttocks. 2. Do not rinse. Discard the cloth and let the area air-dry. Do not put any substances on your body afterward--such  as powder, lotion, or perfume--unless you are told to do so by your health care provider. Only use lotions that are recommended by the manufacturer. 3. Put on clean clothes or pajamas. Contact a health care provider if:  Your skin gets irritated after scrubbing.  You have questions about using your solution or cloth. Get help right away if:  Your eyes become very red or swollen.  Your eyes itch badly.  Your skin itches badly and is red or swollen.  Your hearing changes.  You have trouble seeing.  You have swelling or tingling in your mouth or throat.  You have trouble breathing.  You swallow any chlorhexidine. Summary  Chlorhexidine gluconate (CHG) is a germ-killing (antiseptic) solution that is used to clean the skin. Cleaning your skin with CHG may help to lower your risk for infection.  You may be given CHG to use for bathing. It may be in a bottle or in a prepackaged cloth to use on your skin. Carefully follow your  health care provider's instructions and the instructions on the product label.  Do not use CHG if you have a chlorhexidine allergy.  Contact your health care provider if your skin gets irritated after scrubbing. This information is not intended to replace advice given to you by your health care provider. Make sure you discuss any questions you have with your health care provider. Document Revised: 12/12/2018 Document Reviewed: 08/23/2017 Elsevier Patient Education  [place].

## 2020-05-11 ENCOUNTER — Encounter (HOSPITAL_COMMUNITY): Payer: Self-pay

## 2020-05-11 ENCOUNTER — Encounter (HOSPITAL_COMMUNITY)
Admission: RE | Admit: 2020-05-11 | Discharge: 2020-05-11 | Disposition: A | Discharge status: Home / Self Care | Payer: Medicare Other | Source: Ambulatory Visit | Attending: Podiatry | Admitting: Podiatry

## 2020-05-11 ENCOUNTER — Other Ambulatory Visit (HOSPITAL_COMMUNITY)
Admission: RE | Admit: 2020-05-11 | Discharge: 2020-05-11 | Disposition: A | Discharge status: Home / Self Care | Payer: Medicare Other | Source: Ambulatory Visit | Attending: Podiatry | Admitting: Podiatry

## 2020-05-11 ENCOUNTER — Ambulatory Visit (HOSPITAL_COMMUNITY)
Admission: RE | Admit: 2020-05-11 | Discharge: 2020-05-11 | Disposition: A | Discharge status: Home / Self Care | Payer: Medicare Other | Source: Ambulatory Visit | Attending: Podiatry | Admitting: Podiatry

## 2020-05-11 ENCOUNTER — Other Ambulatory Visit: Payer: Self-pay

## 2020-05-11 DIAGNOSIS — L97512 Non-pressure chronic ulcer of other part of right foot with fat layer exposed: Secondary | ICD-10-CM | POA: Insufficient documentation

## 2020-05-11 DIAGNOSIS — Z20822 Contact with and (suspected) exposure to covid-19: Secondary | ICD-10-CM | POA: Insufficient documentation

## 2020-05-11 DIAGNOSIS — Z01812 Encounter for preprocedural laboratory examination: Secondary | ICD-10-CM | POA: Insufficient documentation

## 2020-05-11 HISTORY — DX: Chronic kidney disease, unspecified: N18.9

## 2020-05-11 LAB — BASIC METABOLIC PANEL
Anion gap: 11 (ref 5–15)
BUN: 15 mg/dL (ref 8–23)
CO2: 26 mmol/L (ref 22–32)
Calcium: 9.3 mg/dL (ref 8.9–10.3)
Chloride: 102 mmol/L (ref 98–111)
Creatinine, Ser: 2.05 mg/dL — ABNORMAL HIGH (ref 0.61–1.24)
GFR calc Af Amer: 37 mL/min — ABNORMAL LOW (ref >60)
GFR calc non Af Amer: 32 mL/min — ABNORMAL LOW (ref >60)
Glucose, Bld: 135 mg/dL — ABNORMAL HIGH (ref 70–99)
Potassium: 3.4 mmol/L — ABNORMAL LOW (ref 3.5–5.1)
Sodium: 139 mmol/L (ref 135–145)

## 2020-05-11 LAB — CBC WITH DIFFERENTIAL/PLATELET
Abs Immature Granulocytes: 0.03 10*3/uL (ref 0.00–0.07)
Basophils Absolute: 0.1 10*3/uL (ref 0.0–0.1)
Basophils Relative: 1 %
Eosinophils Absolute: 0.2 10*3/uL (ref 0.0–0.5)
Eosinophils Relative: 2 %
HCT: 35.5 % — ABNORMAL LOW (ref 39.0–52.0)
Hemoglobin: 11.7 g/dL — ABNORMAL LOW (ref 13.0–17.0)
Immature Granulocytes: 0 %
Lymphocytes Relative: 23 %
Lymphs Abs: 2.2 10*3/uL (ref 0.7–4.0)
MCH: 30.2 pg (ref 26.0–34.0)
MCHC: 33 g/dL (ref 30.0–36.0)
MCV: 91.5 fL (ref 80.0–100.0)
Monocytes Absolute: 0.9 10*3/uL (ref 0.1–1.0)
Monocytes Relative: 10 %
Neutro Abs: 6.2 10*3/uL (ref 1.7–7.7)
Neutrophils Relative %: 64 %
Platelets: 260 10*3/uL (ref 150–400)
RBC: 3.88 MIL/uL — ABNORMAL LOW (ref 4.22–5.81)
RDW: 13.6 % (ref 11.5–15.5)
WBC: 9.7 10*3/uL (ref 4.0–10.5)
nRBC: 0 % (ref 0.0–0.2)

## 2020-05-11 LAB — SARS CORONAVIRUS 2 (TAT 6-24 HRS): SARS Coronavirus 2: NEGATIVE

## 2020-05-11 LAB — HEMOGLOBIN A1C
Hgb A1c MFr Bld: 5 % (ref 4.8–5.6)
Mean Plasma Glucose: 96.8 mg/dL

## 2020-05-11 LAB — GLUCOSE, CAPILLARY: Glucose-Capillary: 130 mg/dL — ABNORMAL HIGH (ref 70–99)

## 2020-05-12 ENCOUNTER — Ambulatory Visit (HOSPITAL_COMMUNITY): Payer: Medicare Other | Admitting: Anesthesiology

## 2020-05-12 ENCOUNTER — Encounter (HOSPITAL_COMMUNITY)
Admission: RE | Disposition: A | Discharge status: Home / Self Care | Payer: Self-pay | Source: Home / Self Care | Attending: Podiatry

## 2020-05-12 ENCOUNTER — Ambulatory Visit (HOSPITAL_COMMUNITY)
Admission: RE | Admit: 2020-05-12 | Discharge: 2020-05-12 | Disposition: A | Discharge status: Home / Self Care | Payer: Medicare Other | Attending: Podiatry | Admitting: Podiatry

## 2020-05-12 ENCOUNTER — Ambulatory Visit (HOSPITAL_COMMUNITY): Payer: Medicare Other

## 2020-05-12 ENCOUNTER — Encounter (HOSPITAL_COMMUNITY): Payer: Self-pay | Admitting: Podiatry

## 2020-05-12 DIAGNOSIS — M1990 Unspecified osteoarthritis, unspecified site: Secondary | ICD-10-CM | POA: Diagnosis not present

## 2020-05-12 DIAGNOSIS — E1142 Type 2 diabetes mellitus with diabetic polyneuropathy: Secondary | ICD-10-CM | POA: Diagnosis not present

## 2020-05-12 DIAGNOSIS — E785 Hyperlipidemia, unspecified: Secondary | ICD-10-CM | POA: Diagnosis not present

## 2020-05-12 DIAGNOSIS — Z992 Dependence on renal dialysis: Secondary | ICD-10-CM | POA: Insufficient documentation

## 2020-05-12 DIAGNOSIS — E1122 Type 2 diabetes mellitus with diabetic chronic kidney disease: Secondary | ICD-10-CM | POA: Diagnosis not present

## 2020-05-12 DIAGNOSIS — Z8673 Personal history of transient ischemic attack (TIA), and cerebral infarction without residual deficits: Secondary | ICD-10-CM | POA: Insufficient documentation

## 2020-05-12 DIAGNOSIS — Z89422 Acquired absence of other left toe(s): Secondary | ICD-10-CM | POA: Insufficient documentation

## 2020-05-12 DIAGNOSIS — Z79899 Other long term (current) drug therapy: Secondary | ICD-10-CM | POA: Insufficient documentation

## 2020-05-12 DIAGNOSIS — L97519 Non-pressure chronic ulcer of other part of right foot with unspecified severity: Secondary | ICD-10-CM | POA: Diagnosis present

## 2020-05-12 DIAGNOSIS — Z89421 Acquired absence of other right toe(s): Secondary | ICD-10-CM | POA: Insufficient documentation

## 2020-05-12 DIAGNOSIS — I120 Hypertensive chronic kidney disease with stage 5 chronic kidney disease or end stage renal disease: Secondary | ICD-10-CM | POA: Diagnosis not present

## 2020-05-12 DIAGNOSIS — Z9889 Other specified postprocedural states: Secondary | ICD-10-CM

## 2020-05-12 HISTORY — PX: OSTECTOMY: SHX6439

## 2020-05-12 HISTORY — PX: WOUND DEBRIDEMENT: SHX247

## 2020-05-12 LAB — GLUCOSE, CAPILLARY
Glucose-Capillary: 104 mg/dL — ABNORMAL HIGH (ref 70–99)
Glucose-Capillary: 106 mg/dL — ABNORMAL HIGH (ref 70–99)

## 2020-05-12 SURGERY — OSTECTOMY
Anesthesia: General | Site: Foot | Laterality: Right

## 2020-05-12 MED ORDER — SODIUM CHLORIDE 0.9 % IR SOLN
Status: DC | PRN
  Administered 2020-05-12: 1000 mL

## 2020-05-12 MED ORDER — CHLORHEXIDINE GLUCONATE 0.12 % MT SOLN
15.0000 mL | Freq: Once | OROMUCOSAL | Status: AC
  Administered 2020-05-12: 15 mL via OROMUCOSAL

## 2020-05-12 MED ORDER — LIDOCAINE 2% (20 MG/ML) 5 ML SYRINGE
INTRAMUSCULAR | Status: AC
  Filled 2020-05-12: qty 5

## 2020-05-12 MED ORDER — FENTANYL CITRATE (PF) 100 MCG/2ML IJ SOLN
INTRAMUSCULAR | Status: DC | PRN
  Administered 2020-05-12: 25 ug via INTRAVENOUS

## 2020-05-12 MED ORDER — CLINDAMYCIN PHOSPHATE 900 MG/50ML IV SOLN
900.0000 mg | INTRAVENOUS | Status: AC
  Administered 2020-05-12: 900 mg via INTRAVENOUS
  Filled 2020-05-12: qty 50

## 2020-05-12 MED ORDER — LIDOCAINE HCL (CARDIAC) PF 100 MG/5ML IV SOSY
PREFILLED_SYRINGE | INTRAVENOUS | Status: DC | PRN
  Administered 2020-05-12: 40 mg via INTRAVENOUS

## 2020-05-12 MED ORDER — BUPIVACAINE HCL (PF) 0.5 % IJ SOLN
INTRAMUSCULAR | Status: DC | PRN
  Administered 2020-05-12: 20 mL

## 2020-05-12 MED ORDER — PROPOFOL 10 MG/ML IV BOLUS
INTRAVENOUS | Status: DC | PRN
  Administered 2020-05-12: 20 mg via INTRAVENOUS

## 2020-05-12 MED ORDER — CHLORHEXIDINE GLUCONATE CLOTH 2 % EX PADS: 6.0000 | MEDICATED_PAD | Freq: Once | CUTANEOUS | Status: DC

## 2020-05-12 MED ORDER — PROPOFOL 500 MG/50ML IV EMUL
INTRAVENOUS | Status: DC | PRN
  Administered 2020-05-12: 50 ug/kg/min via INTRAVENOUS

## 2020-05-12 MED ORDER — SODIUM CHLORIDE 0.9 % IV SOLN: INTRAVENOUS | Status: DC

## 2020-05-12 MED ORDER — FENTANYL CITRATE (PF) 100 MCG/2ML IJ SOLN
INTRAMUSCULAR | Status: AC
  Filled 2020-05-12: qty 2

## 2020-05-12 MED ORDER — ORAL CARE MOUTH RINSE: 15.0000 mL | Freq: Once | OROMUCOSAL | Status: AC

## 2020-05-12 MED ORDER — MIDAZOLAM HCL 2 MG/2ML IJ SOLN
INTRAMUSCULAR | Status: AC
  Filled 2020-05-12: qty 2

## 2020-05-12 MED ORDER — HEPARIN SOD (PORK) LOCK FLUSH 100 UNIT/ML IV SOLN
250.0000 [IU] | INTRAVENOUS | Status: DC | PRN
  Filled 2020-05-12: qty 5

## 2020-05-12 MED ORDER — MEPERIDINE HCL 50 MG/ML IJ SOLN: 6.2500 mg | INTRAMUSCULAR | Status: DC | PRN

## 2020-05-12 MED ORDER — HYDROMORPHONE HCL 1 MG/ML IJ SOLN: 0.2500 mg | INTRAMUSCULAR | Status: DC | PRN

## 2020-05-12 MED ORDER — ONDANSETRON HCL 4 MG/2ML IJ SOLN: 4.0000 mg | Freq: Once | INTRAMUSCULAR | Status: DC | PRN

## 2020-05-12 MED ORDER — BUPIVACAINE HCL (PF) 0.5 % IJ SOLN
INTRAMUSCULAR | Status: AC
  Filled 2020-05-12: qty 30

## 2020-05-12 MED ORDER — PROPOFOL 10 MG/ML IV BOLUS
INTRAVENOUS | Status: AC
  Filled 2020-05-12: qty 40

## 2020-05-12 MED ORDER — LACTATED RINGERS IV SOLN: Freq: Once | INTRAVENOUS | Status: AC

## 2020-05-12 MED ORDER — MIDAZOLAM HCL 5 MG/5ML IJ SOLN
INTRAMUSCULAR | Status: DC | PRN
  Administered 2020-05-12: 2 mg via INTRAVENOUS

## 2020-05-12 SURGICAL SUPPLY — 37 items
APL SKNCLS STERI-STRIP NONHPOA (GAUZE/BANDAGES/DRESSINGS) ×1
BANDAGE ESMARK 4X12 BL STRL LF (DISPOSABLE) ×1 IMPLANT
BENZOIN TINCTURE PRP APPL 2/3 (GAUZE/BANDAGES/DRESSINGS) ×3 IMPLANT
BLADE OSC/SAGITTAL MD 9X18.5 (BLADE) ×2 IMPLANT
BLADE SURG 15 STRL LF DISP TIS (BLADE) ×2 IMPLANT
BLADE SURG 15 STRL SS (BLADE) ×6
BNDG CMPR 12X4 ELC STRL LF (DISPOSABLE) ×1
BNDG CMPR STD VLCR NS LF 5.8X4 (GAUZE/BANDAGES/DRESSINGS) ×1
BNDG CONFORM 2 STRL LF (GAUZE/BANDAGES/DRESSINGS) ×3 IMPLANT
BNDG ELASTIC 4X5.8 VLCR NS LF (GAUZE/BANDAGES/DRESSINGS) ×3 IMPLANT
BNDG ESMARK 4X12 BLUE STRL LF (DISPOSABLE) ×3
BNDG GAUZE ELAST 4 BULKY (GAUZE/BANDAGES/DRESSINGS) ×3 IMPLANT
CLOTH BEACON ORANGE TIMEOUT ST (SAFETY) ×3 IMPLANT
COVER LIGHT HANDLE STERIS (MISCELLANEOUS) ×6 IMPLANT
COVER WAND RF STERILE (DRAPES) ×6 IMPLANT
CUFF TOURN SGL QUICK 18X4 (TOURNIQUET CUFF) ×2 IMPLANT
DECANTER SPIKE VIAL GLASS SM (MISCELLANEOUS) ×3 IMPLANT
DRSG ADAPTIC 3X8 NADH LF (GAUZE/BANDAGES/DRESSINGS) ×3 IMPLANT
ELECT REM PT RETURN 9FT ADLT (ELECTROSURGICAL) ×3
ELECTRODE REM PT RTRN 9FT ADLT (ELECTROSURGICAL) ×1 IMPLANT
GAUZE SPONGE 4X4 12PLY STRL (GAUZE/BANDAGES/DRESSINGS) ×5 IMPLANT
GLOVE BIO SURGEON STRL SZ7 (GLOVE) ×3 IMPLANT
GLOVE BIO SURGEON STRL SZ7.5 (GLOVE) ×3 IMPLANT
GLOVE BIOGEL PI IND STRL 7.0 (GLOVE) ×2 IMPLANT
GLOVE BIOGEL PI INDICATOR 7.0 (GLOVE) ×4
GOWN STRL REUS W/TWL LRG LVL3 (GOWN DISPOSABLE) ×6 IMPLANT
MANIFOLD NEPTUNE II (INSTRUMENTS) ×3 IMPLANT
NDL HYPO 27GX1-1/4 (NEEDLE) ×3 IMPLANT
NEEDLE HYPO 27GX1-1/4 (NEEDLE) ×6 IMPLANT
NS IRRIG 1000ML POUR BTL (IV SOLUTION) ×3 IMPLANT
PACK BASIC LIMB (CUSTOM PROCEDURE TRAY) ×3 IMPLANT
PAD ARMBOARD 7.5X6 YLW CONV (MISCELLANEOUS) ×3 IMPLANT
SET BASIN LINEN APH (SET/KITS/TRAYS/PACK) ×3 IMPLANT
SPONGE LAP 18X18 RF (DISPOSABLE) ×3 IMPLANT
SUT ETHILON 4 0 PS 2 18 (SUTURE) ×2 IMPLANT
SUT VICRYL AB 3-0 FS1 BRD 27IN (SUTURE) ×2 IMPLANT
SYR CONTROL 10ML LL (SYRINGE) ×6 IMPLANT

## 2020-05-12 NOTE — Transfer of Care (Signed)
Immediate Anesthesia Transfer of Care Note  Patient: James Gillespie  Procedure(s) Performed: 5TH METATARSAL HEAD RESECTION (Right Foot) DEBRIDEMENT ULCERATION (Right Foot)  Patient Location: PACU  Anesthesia Type:MAC  Level of Consciousness: awake, alert  and oriented  Airway & Oxygen Therapy: Patient Spontanous Breathing and Patient connected to nasal cannula oxygen  Post-op Assessment: Report given to RN and Post -op Vital signs reviewed and stable  Post vital signs: Reviewed and stable  Last Vitals:  Vitals Value Taken Time  BP 114/67 05/12/20 0949  Temp    Pulse 55 05/12/20 0951  Resp 19 05/12/20 0951  SpO2 99 % 05/12/20 0951  Vitals shown include unvalidated device data.  Last Pain:  Vitals:   05/12/20 0737  TempSrc: Oral  PainSc: 0-No pain      Patients Stated Pain Goal: 5 (65/78/46 9629)  Complications: No complications documented.

## 2020-05-12 NOTE — Progress Notes (Signed)
Changed Patients central line dressing per doctor order. New tubing placed and heparin flushed.  Hillery Jacks, RN 10:44 AM 05/12/20

## 2020-05-12 NOTE — Brief Op Note (Signed)
BRIEF OPERATIVE NOTE  DATE OF PROCEDURE 05/12/2020  SURGEON Marcheta Grammes, DPM  ASSISTANT SURGEON None  OR STAFF Circulator: Connye Burkitt, RN Scrub Person: Karin Lieu, CST   PREOPERATIVE DIAGNOSIS 1.  Ulceration, right foot 2.  Peripheral neuropathy  POSTOPERATIVE DIAGNOSIS Same  PROCEDURE 1.  5th metatarsal head resection, right foot 2.  Debridement of ulceration, right foot  ANESTHESIA Monitor Anesthesia Care   HEMOSTASIS Pneumatic ankle tourniquet set at 250 mmHg  ESTIMATED BLOOD LOSS Minimal (<5 cc)  MATERIALS USED None  INJECTABLES 0.5% Marcaine plain  PATHOLOGY 1.  Soft tissue from right foot to microbiology for culture and sensitivity 2.  Right 5th metatarsal head to pathology 3.  Wafer of right 5th metatarsal to pathology for margins  COMPLICATIONS None

## 2020-05-12 NOTE — Anesthesia Postprocedure Evaluation (Signed)
Anesthesia Post Note  Patient: James Gillespie  Procedure(s) Performed: 5TH METATARSAL HEAD RESECTION (Right Foot) DEBRIDEMENT ULCERATION (Right Foot)  Patient location during evaluation: PACU Anesthesia Type: MAC Level of consciousness: awake and alert and oriented Pain management: pain level controlled Vital Signs Assessment: post-procedure vital signs reviewed and stable Respiratory status: spontaneous breathing Cardiovascular status: blood pressure returned to baseline and stable Postop Assessment: no apparent nausea or vomiting Anesthetic complications: no   No complications documented.   Last Vitals:  Vitals:   05/12/20 1000 05/12/20 1011  BP: 106/65 113/74  Pulse: (!) 52 (!) 54  Resp: 18 13  Temp:  (!) 36.4 C  SpO2: 99% 98%    Last Pain:  Vitals:   05/12/20 1013  TempSrc:   PainSc: 0-No pain                 Daleen Bo Adriana Lina

## 2020-05-12 NOTE — Discharge Instructions (Signed)
These instructions will give you an idea of what to expect after surgery and how to manage issues that may arise before your first post op office visit.  Pain Management Pain is best managed by "staying ahead" of it. If pain gets out of control, it is difficult to get it back under control. Local anesthesia that lasts 6-8 hours is used to numb the foot and decrease pain.  For the best pain control, take the pain medication every 4 hours for the first 2 days post op. On the third day pain medication can be taken as needed.   Post Op Nausea Nausea is common after surgery, so it is managed proactively.  If prescribed, use the prescribed nausea medication regularly for the first 2 days post op.  Bandages Do not worry if there is blood on the bandage. What looks like a lot of blood on the bandage is actually a small amount. Blood on the dressing spreads out as it is absorbed by the gauze, the same way a drop of water spreads out on a paper towel.  If the bandages feel wet or dry, stiff and uncomfortable, call the office during office hours and we will schedule a time for you to have the bandage changed.  Unless you are specifically told otherwise, we will do the first bandage change in the office.  Keep your bandage dry. If the bandage becomes wet or soiled, notify the office and we will schedule a time to change the bandage.  Activity It is best to spend most of the first 2 days after surgery lying down with the foot elevated above the level of your heart. You may put weight on your heel while wearing the surgical shoe.   You may only get up to go to the restroom.  Driving Do not drive until you are able to respond in an emergency (i.e. slam on the brakes). This usually occurs after the bone has healed - 6 to 8 weeks.  Call the Office If you have a fever over 101F.  If you have increasing pain after the initial post op pain has settled down.  If you have increasing redness, swelling, or  drainage.  If you have any questions or concerns.   Wound Care, Adult Taking care of your wound properly can help to prevent pain, infection, and scarring. It can also help your wound to heal more quickly. How to care for your wound Wound care      Follow instructions from your health care provider about how to take care of your wound. Make sure you: ? Wash your hands with soap and water before you change the bandage (dressing). If soap and water are not available, use hand sanitizer. ? Change your dressing as told by your health care provider. ? Leave stitches (sutures), skin glue, or adhesive strips in place. These skin closures may need to stay in place for 2 weeks or longer. If adhesive strip edges start to loosen and curl up, you may trim the loose edges. Do not remove adhesive strips completely unless your health care provider tells you to do that.  Check your wound area every day for signs of infection. Check for: ? Redness, swelling, or pain. ? Fluid or blood. ? Warmth. ? Pus or a bad smell.  Ask your health care provider if you should clean the wound with mild soap and water. Doing this may include: ? Using a clean towel to pat the wound dry after cleaning it.  Do not rub or scrub the wound. ? Applying a cream or ointment. Do this only as told by your health care provider. ? Covering the incision with a clean dressing.  Ask your health care provider when you can leave the wound uncovered.  Keep the dressing dry until your health care provider says it can be removed. Do not take baths, swim, use a hot tub, or do anything that would put the wound underwater until your health care provider approves. Ask your health care provider if you can take showers. You may only be allowed to take sponge baths. Medicines   If you were prescribed an antibiotic medicine, cream, or ointment, take or use the antibiotic as told by your health care provider. Do not stop taking or using the  antibiotic even if your condition improves.  Take over-the-counter and prescription medicines only as told by your health care provider. If you were prescribed pain medicine, take it 30 or more minutes before you do any wound care or as told by your health care provider. General instructions  Return to your normal activities as told by your health care provider. Ask your health care provider what activities are safe.  Do not scratch or pick at the wound.  Do not use any products that contain nicotine or tobacco, such as cigarettes and e-cigarettes. These may delay wound healing. If you need help quitting, ask your health care provider.  Keep all follow-up visits as told by your health care provider. This is important.  Eat a diet that includes protein, vitamin A, vitamin C, and other nutrient-rich foods to help the wound heal. ? Foods rich in protein include meat, dairy, beans, nuts, and other sources. ? Foods rich in vitamin A include carrots and dark green, leafy vegetables. ? Foods rich in vitamin C include citrus, tomatoes, and other fruits and vegetables. ? Nutrient-rich foods have protein, carbohydrates, fat, vitamins, or minerals. Eat a variety of healthy foods including vegetables, fruits, and whole grains. Contact a health care provider if:  You received a tetanus shot and you have swelling, severe pain, redness, or bleeding at the injection site.  Your pain is not controlled with medicine.  You have redness, swelling, or pain around the wound.  You have fluid or blood coming from the wound.  Your wound feels warm to the touch.  You have pus or a bad smell coming from the wound.  You have a fever or chills.  You are nauseous or you vomit.  You are dizzy. Get help right away if:  You have a red streak going away from your wound.  The edges of the wound open up and separate.  Your wound is bleeding, and the bleeding does not stop with gentle pressure.  You have a  rash.  You faint.  You have trouble breathing. Summary  Always wash your hands with soap and water before changing your bandage (dressing).  To help with healing, eat foods that are rich in protein, vitamin A, vitamin C, and other nutrients.  Check your wound every day for signs of infection. Contact your health care provider if you suspect that your wound is infected. This information is not intended to replace advice given to you by your health care provider. Make sure you discuss any questions you have with your health care provider. Document Revised: 01/13/2019 Document Reviewed: 04/11/2016 Elsevier Patient Education  [employer].

## 2020-05-12 NOTE — Op Note (Signed)
OPERATIVE NOTE  DATE OF PROCEDURE 05/12/2020  SURGEON Marcheta Grammes, DPM  ASSISTANT SURGEON None  OR STAFF Circulator: Connye Burkitt, RN Scrub Person: Karin Lieu, CST   PREOPERATIVE DIAGNOSIS 1.  Ulceration, right foot 2.  Peripheral neuropathy  POSTOPERATIVE DIAGNOSIS Same  PROCEDURE 1.  5th metatarsal head resection, right foot 2.  Debridement of ulceration, right foot  ANESTHESIA Monitor Anesthesia Care   HEMOSTASIS Pneumatic ankle tourniquet set at 250 mmHg  ESTIMATED BLOOD LOSS Minimal (<5 cc)  MATERIALS USED None  INJECTABLES 0.5% Marcaine plain  PATHOLOGY 1.  Soft tissue from right foot to microbiology for culture and sensitivity 2.  Right 5th metatarsal head to pathology 3.  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  DESCRIPTION OF THE PROCEDURE:  The patient was brought to the operating room and placed on the operative table in the supine position.  A pneumatic ankle tourniquet was applied to the operative extremity.  A timeout was performed.  Following sedation, the surgical site was anesthetized with 0.5% Marcaine plain.  The foot was then prepped, scrubbed, and draped in the usual sterile technique.  A second timeout was performed.  The foot was elevated, exsanguinated and the pneumatic ankle tourniquet inflated to 250 mmHg.    Attention was directed to the dorsal lateral aspect of the right foot.  A linear longitudinal incision was made overlying the distal aspect of the fifth metatarsal.  Dissection was continued deep down to the level of the fifth metatarsal.  A linear periosteal and capsular incision was performed.  The periosteal and capsular structures were reflected medially and laterally exposing the fifth metatarsal.  Using a power bone saw a transverse osteotomy was performed  proximal to the surgical neck.  The fifth metatarsal head was freed of all soft tissue attachments and passed from the operative field.  No clearly defined erosive changes of the fifth metatarsal were visualized.  The bone was sent to pathology for evaluation.  The surgical wound was irrigated with copious amounts of sterile irrigant.  A second osteotomy was performed proximal and parallel to the original osteotomy creating a small segment of bone which was passed from the operative field and sent to pathology for margins.  Attention was directed to the plantar aspect of the right foot.  A full-thickness ulceration plantar to the fifth metatarsal head region and measured 0.6 x 0.9 x 0.3 cm.  The ulceration required debridement of the adherent, nonviable fibrotic tissue.  A sharp excisional debridement of the ulceration was performed using a #15 blade and scissor.  The wound bed was comprised of 25% fibrotic tissue and 75% pink-red viable tissue.  [The adherent, nonviable fibrotic tissue] including subcutaneous tissue and underlying muscle was debrided and removed.  The ulceration was irrigated with copious amounts of sterile irrigant.  A piece of soft tissue was removed from the wound bed and sent to microbiology for aerobic and anaerobic culture and sensitivity.  Attention was redirected to the surgical wound along the dorsal aspect of the right foot.  The periosteal and capsular structures were reapproximated using 4-0 Vicryl.  The subcutaneous structures were reapproximated using 4-0 Vicryl.  The skin was reapproximated using 4-0 Prolene.  A sterile compressive dressing was applied to the right foot.  The pneumatic ankle tourniquet was deflated and a prompt hyperemic response was noted to all  remaining digits of the right foot.   The patient tolerated the procedure well.  The patient was then transferred to PACU with vital signs stable.

## 2020-05-12 NOTE — H&P (Signed)
HISTORY AND PHYSICAL INTERVAL NOTE:  05/12/2020  8:25 AM  James Gillespie  has presented today for surgery, with the diagnosis of ulceration of the right foot and peripheral neuropathy.  The various methods of treatment have been discussed with the patient.  No guarantees were given.  After consideration of risks, benefits and other options for treatment, the patient has consented to surgery.  I have reviewed the patients' chart and labs.    Patient Vitals for the past 24 hrs:  BP Temp Temp src Pulse Resp SpO2  05/12/20 0737 129/69 97.7 F (36.5 C) Oral 60 16 98 %    A history and physical examination was performed in my office.  The patient was reexamined.  There have been no changes to this history and physical examination.  Marcheta Grammes, DPM

## 2020-05-12 NOTE — Anesthesia Preprocedure Evaluation (Addendum)
Anesthesia Evaluation  Patient identified by MRN, date of birth, ID band Patient awake    Reviewed: Allergy & Precautions, NPO status , Patient's Chart, lab work & pertinent test results  History of Anesthesia Complications Negative for: history of anesthetic complications  Airway Mallampati: II  TM Distance: >3 FB Neck ROM: Full    Dental  (+) Teeth Intact, Dental Advisory Given, Caps   Pulmonary    Pulmonary exam normal breath sounds clear to auscultation       Cardiovascular hypertension, Pt. on medications Normal cardiovascular exam Rhythm:Regular Rate:Normal     Neuro/Psych  Neuromuscular disease CVA, Residual Symptoms negative psych ROS   GI/Hepatic negative GI ROS, (+)     substance abuse  alcohol use,   Endo/Other  diabetes  Renal/GU DialysisRenal disease     Musculoskeletal  (+) Arthritis , Osteoarthritis,    Abdominal   Peds  Hematology negative hematology ROS (+)   Anesthesia Other Findings   Reproductive/Obstetrics                             Anesthesia Physical Anesthesia Plan  ASA: III  Anesthesia Plan: General   Post-op Pain Management:    Induction: Intravenous  PONV Risk Score and Plan:   Airway Management Planned: Nasal Cannula, Natural Airway and Simple Face Mask  Additional Equipment:   Intra-op Plan:   Post-operative Plan:   Informed Consent: I have reviewed the patients History and Physical, chart, labs and discussed the procedure including the risks, benefits and alternatives for the proposed anesthesia with the patient or authorized representative who has indicated his/her understanding and acceptance.     Dental advisory given  Plan Discussed with: CRNA and Surgeon  Anesthesia Plan Comments:         Anesthesia Quick Evaluation

## 2020-05-13 ENCOUNTER — Encounter (HOSPITAL_COMMUNITY): Payer: Self-pay | Admitting: Podiatry

## 2020-05-14 LAB — SURGICAL PATHOLOGY

## 2020-05-17 LAB — AEROBIC/ANAEROBIC CULTURE W GRAM STAIN (SURGICAL/DEEP WOUND)
Culture: NO GROWTH
Gram Stain: NONE SEEN

## 2024-07-25 NOTE — Progress Notes (Signed)
 Remote received

## 2024-08-06 ENCOUNTER — Other Ambulatory Visit (HOSPITAL_COMMUNITY): Payer: Self-pay | Admitting: Podiatry

## 2024-08-06 DIAGNOSIS — M86671 Other chronic osteomyelitis, right ankle and foot: Secondary | ICD-10-CM

## 2024-08-07 ENCOUNTER — Encounter (HOSPITAL_COMMUNITY): Payer: Self-pay

## 2024-08-07 ENCOUNTER — Other Ambulatory Visit (HOSPITAL_COMMUNITY): Payer: Self-pay | Admitting: Podiatry

## 2024-08-07 ENCOUNTER — Ambulatory Visit (HOSPITAL_COMMUNITY)
Admission: RE | Admit: 2024-08-07 | Discharge: 2024-08-07 | Disposition: A | Discharge status: Home / Self Care | Source: Ambulatory Visit | Attending: Podiatry | Admitting: Podiatry

## 2024-08-07 DIAGNOSIS — M86671 Other chronic osteomyelitis, right ankle and foot: Secondary | ICD-10-CM | POA: Diagnosis present

## 2024-08-18 NOTE — Telephone Encounter (Signed)
 General PCP: Primary Care Providers: [name], MD Resident PCP: No primary care provider on file.  Requested Prescriptions   Pending Prescriptions Disp Refills  . atorvastatin (LIPITOR) 80 mg Tablet [Pharmacy Med Name: ATORVASTATIN TABS 80MG ] 30 tablet 3    Sig: take 1 tablet every night by mouth .    Confirm the patient has been seen for the condition(s) associated with medication(s) requiring monitoring.     Recent Visits Date Type Provider Dept  05/20/24 Office Visit [name], MD [facility]  03/14/24 Medicare Annual Visit [name], MD [facility]  02/25/24 Office Visit Pallone, Alan ORN, MD [facility]  01/11/24 Office Visit Pallone, Alan ORN, MD [facility]  11/28/23 Office Visit Gladis Quinn LABOR, NP-C [facility]  11/21/23 Office Visit Gladis Quinn LABOR, NP-C [facility]  10/19/23 Office Visit Anitra Delon ORN, NP-C [facility]  10/08/23 Office Visit Claudene Eleanor DEL, FNP [facility]  09/10/23 Office Visit [name], MD [facility]  07/12/23 Office Visit [name], MD [facility]  Showing recent visits within past 540 days with a meds authorizing provider and meeting all other requirements Future Appointments Date Type Provider Dept  08/22/24 Appointment [name], MD [facility]  Showing future appointments within next 270 days with a meds authorizing provider and meeting all other requirements   Follow up action taken if any   Pertinent Vitals & Labs  BP Readings from Last 3 Encounters:  05/20/24 100/60  04/11/24 110/80  04/08/24 122/78   Pulse Readings from Last 3 Encounters:  05/20/24 61  04/11/24 72  04/08/24 70   Lab Results  Component Value Date   HGBA1C 6.1 (A) 09/10/2023   HGBA1C 5.8 01/25/2023   Lab Results  Component Value Date   CREATININE 1.67 (H) 03/14/2024   CREATININE 1.48 (H)  02/12/2024   CREATININE 1.60 (H) 02/11/2024   CREATININE not reported 03/24/2020   Lab Results  Component Value Date   NA 142 03/14/2024   NA 141 02/12/2024   K 4.4 03/14/2024   K 4.4 02/12/2024   CO2 26 03/14/2024   CO2 23 02/12/2024   Lab Results  Component Value Date   TSH 2.165 03/14/2024   TSH 3.105 09/10/2023   Lab Results  Component Value Date   CHOL 115 03/14/2024   CHOL 122 09/10/2023   TRIG 98 03/14/2024   TRIG 108 09/10/2023   HDL 48 03/14/2024   HDL 45 09/10/2023   LDLCALC 47 03/14/2024   LDLCALC 55 09/10/2023   Lab Results  Component Value Date   INR 1.1 07/17/2022     Lab Results  Component Value Date   WBC 9.2 03/14/2024   WBC 7.4 02/12/2024   HGB 15.8 03/14/2024   HGB 17.1 (H) 02/12/2024   HCT 50.2 (H) 03/14/2024   HCT 51.7 (H) 02/12/2024   PLT 217 03/14/2024   PLT 214 02/12/2024   MCV 101.0 (H) 03/14/2024   MCV 97.9 02/12/2024   Lab Results  Component Value Date   ALT 17 03/14/2024   AST 28 03/14/2024   ALKPHOS 137 (H) 03/14/2024   BILITOT 0.7 03/14/2024    Jody M Altizer, LPN 88/89/7974

## 2024-08-22 NOTE — Progress Notes (Signed)
 Patient:   James Gillespie                                        CSN:             [id number] DOB:       1950-11-14                                          MRN:            [id number]    SUBJECTIVE   History was provided by the patient.  HPI: Patient is a 73 y.o. male who comes in for follow-up on his chronic medical problems as listed below.  Patient presents to the office for routine follow up. Continues to have ongoing dizziness. Mobility has decreased over the past few years. Using a walker in the home for ambulation, sometimes uses a quad based cane. Has fallen due to dizziness. Lives alone. Sister helps with providing meals. Notes more trouble with his memory. Considering moving to [place] with his son and daughter in social worker. Does not check his BP at home. Taking his medications, decreased his hydralazine to BID. Cut back his gabapentin to once daily. Continues to drink alcohol, has not cut back. Reports drinking 2 beers per day and 1/5 liquor weekly. Following with podiatry, receiving home health nursing for wound care. Not interested in trying therapy again.   Past Medical History: Past Medical History:  Diagnosis Date  . Alcohol abuse   . Arthritis   . Benign prostatic hyperplasia   . Carpal tunnel syndrome   . Chronic kidney disease   . CVA (cerebral vascular accident) (CMS-HCC) 04/09/2016  . Dysrhythmia    HX AFIB  . Foot callus   . Heart attack (CMS-HCC)   . HLD (hyperlipidemia) 07/11/2016  . HTN (hypertension), benign 07/11/2016  . Hypertension   . Impaired fasting blood sugar 07/11/2016  . Joint pain   . No family history of adverse response to anesthesia   . Psoriasis   . Renal calculi   . Skin ulcers of foot, bilateral (CMS-HCC) 01/07/2018    Family History: Family History  Problem Relation Name Age of Onset  . Heart Attack Mother    . Emphysema Father    . No Known Problems Sister    . Heart Attack Maternal Uncle    . Heart Attack Maternal Grandfather       Social History: Social History   Socioeconomic History  . Marital status: Divorced  Tobacco Use  . Smoking status: Never  . Smokeless tobacco: Never  Vaping Use  . Vaping status: Never Used  Substance and Sexual Activity  . Alcohol use: Yes    Alcohol/week: 28.0 standard drinks of alcohol    Types: 28 Standard drinks or equivalent per week    Comment: 2 beers, 2 mixed drinks per day  . Drug use: Yes    Frequency: 7.0 times per week    Types: Marijuana  . Sexual activity: Not Currently    Partners: Female   Social Drivers of Health   Financial Resource Strain: Low Risk  (02/14/2024)   Overall Financial Resource Strain (CARDIA)   . Difficulty of Paying Living Expenses: Not hard at all  Food Insecurity: No Food Insecurity (02/14/2024)  Hunger Vital Sign   . Worried About Programme Researcher, Broadcasting/film/video in the Last Year: Never true   . Ran Out of Food in the Last Year: Never true  Transportation Needs: No Transportation Needs (04/11/2024)   OASIS A1250: Transportation   . Lack of Transportation (Medical): No   . Lack of Transportation (Non-Medical): No   . Patient Unable or Declines to Respond: No  Physical Activity: Inactive (02/14/2024)   Exercise Vital Sign   . Days of Exercise per Week: 0 days   . Minutes of Exercise per Session: 0 min  Stress: No Stress Concern Present (02/14/2024)   [employer] of Occupational Health - Occupational Stress Questionnaire   . Feeling of Stress : Not at all  Social Connections: Moderately Isolated (02/14/2024)   Social Connection and Isolation Panel   . Frequency of Communication with Friends and Family: More than three times a week   . Frequency of Social Gatherings with Friends and Family: Once a week   . Attends Religious Services: 1 to 4 times per year   . Active Member of Clubs or Organizations: No   . Attends Banker Meetings: Never   . Marital Status: Divorced  Catering Manager Violence: Not At Risk (02/14/2024)   Humiliation,  Afraid, Rape, and Kick questionnaire   . Fear of Current or Ex-Partner: No   . Emotionally Abused: No   . Physically Abused: No   . Sexually Abused: No  Housing Stability: Low Risk  (02/14/2024)   Housing Stability Vital Sign   . Unable to Pay for Housing in the Last Year: No   . Number of Times Moved in the Last Year: 0   . Homeless in the Last Year: No    Allergies: No Known Allergies  Current Medications: Current Outpatient Medications  Medication Sig Dispense Refill  . atorvastatin (LIPITOR) 80 mg Tablet take 1 tablet every night by mouth . 90 tablet 1  . gabapentin (NEURONTIN) 100 mg Capsule take 2 capsules in the morning and 2 capsules before bedtime by mouth. 360 capsule 1  . furosemide (LASIX) 20 mg Tablet take 1 tablet every morning by mouth . 90 tablet 0  . hydrALAZINE (APRESOLINE) 50 mg Tablet take 1 tablet in the morning and 1 tablet at noon and 1 tablet before bedtime by mouth. 270 tablet 1  . escitalopram oxalate (LEXAPRO) 5 mg Tablet take 1 tablet every day by mouth . 90 tablet 1  . tamsulosin (FLOMAX) 0.4 mg Capsule take 1 capsule every day by mouth . 90 capsule 1  . doxycycline (VIBRAMYCIN) 100 mg Capsule take 100 mg by mouth in the morning and 100 mg before bedtime.    . polyethylene glycol (MIRALAX) powder take 17 g by mouth daily as needed for Other (constipation). mix 17g in 8oz water, tea, coffee, juice, etc    . losartan (COZAAR) 50 mg Tablet take 1 tablet every day by mouth . 90 tablet 1  . FARXIGA 10 mg Tablet take 1 tablet every day by mouth . 90 tablet 0  . acetaminophen  (TYLENOL ) 500 mg Tablet take 1-2 tablets every 8 (eight) hours as needed by mouth  for Pain. 60 tablet 0  . cyanocobalamin (VITAMIN B12) 1,000 mcg Tablet take 2,000 mcg every day by mouth .    . rivaroxaban (XARELTO) 15 mg Tablet take 1 tablet every day by mouth . 90 tablet 1  . aspirin 81 mg Tablet, Chewable take 1 tablet every day by mouth . (  Patient not taking: Reported on 08/22/2024.)      No current facility-administered medications for this visit.    Review of Systems: Items that patient complains of are in bold.  Items that the patient denies are not bolded.  General: Malaise, Fatigue, anorexia  Eyes: Vision changes Cardiovascular: Chest Pain, Palpitations, Peripheral Edema.  Respiratory: Cough, Dyspnea.  Gastrointestinal: Nausea, Vomiting, Diarrhea, Abdominal Pain  Musculoskeletal:  Myalgias.  Skin: Rash.    OBJECTIVE  BP 112/68 (BP Location: Right arm, Patient Position: Sitting)   Pulse 72   Temp 97.6 F (36.4 C) (Temporal)   Resp 18   SpO2 95%  There is no height or weight on file to calculate BMI.  General: Awake, alert, sitting up in chair, clothes dirty, holes in shoes, NAD Neck: Supple, no adenopathy and thyroid: not enlarged, symmetric, no tenderness/mass/nodules Lungs:  Clear to auscultation w/o rales, rhonchi, wheezes w/normal effort and no use of accessory muscles of respiration  Heart: RRR, no murmur, no carotid bruits Neuro: Awake, alert, no focal deficits Psych; Normal affect  Extr: warm, well perfused, no edema  LABS   Lab Results  Component Value Date   CHOL 115 03/14/2024   HDL 48 03/14/2024   LDLCALC 47 03/14/2024   TRIG 98 03/14/2024   NA 142 03/14/2024   K 4.4 03/14/2024   BUN 23 03/14/2024   CREATININE 1.67 (H) 03/14/2024   GLU 86 03/14/2024   CA 8.9 03/14/2024   AST 28 03/14/2024   ALT 17 03/14/2024   ALKPHOS 137 (H) 03/14/2024   TSH 2.165 03/14/2024   HGBA1C 6.1 (A) 09/10/2023     UPDATED IMMUNIZATIONS   Immunization History  Administered Date(s) Administered  . Covid-19 vaccine 2024-2025 Historical 01/05/2020, 02/02/2020  . Pfizer PURPLE (BIONTECH)Sars-CoV-2 Vaccination 09/27/2020  . Pneumococcal conjugate (PCV13) 11/20/2016  . TD FREE 04/01/2012    ASSESSMENT and PLAN     ICD-10-CM   1. HTN (hypertension), benign  I10     2. Hyperlipidemia, unspecified hyperlipidemia type  E78.5     3. Cerebrovascular  accident (CVA), unspecified mechanism (CMS-HCC)  I63.9     4. Dizziness  R42       HTN: BP low in the office, last few readings low.. Will decrease hydralazine to 25mg  TID. Continue with losartan, lasix. Update labs today HLD: Check lipid panel. Cont with statin Hx CVA: Cont statin, AC Dizziness: Agree with decreased gabapentin, discussed taper off. Will decrease hydralazine to avoid hypotension. Advised to use his walker for ambulation. Recommended to patient to have a fall alert. Depression: Continue with lexapro once daily. I have recommended he stop drinking alcohol  Peripheral neuropathy and foot wound: Advised to continue f/u with podiatry. Home health is arranged. Memory loss:  Hx prior CVA, reviewed previous MRI. Previous TSH and B12 normal. We discussed additional treatment options including trial of aricept. He declines any new medications. Recommend puzzles and brain exercises during the day. Continue to monitor We discussed his long term care planning. He is not interested in PT or OT at this time. I am concerned about his ability to continue living alone and caring for himself. He has a sister who is local and helps with meals. I have recommended he consider moving in with his son. His states his son will be visiting this weekend and making plans for his move. I recommend he wear a fall alert.  Reviewed his medications. Med list updated and refills provided Counseled on diet and need for regular exercise. Continue  his current medicines as listed which were reviewed with the patient.  Alan LELON Blase, MD 08/22/2024 2:24 PM

## 2024-08-26 NOTE — Telephone Encounter (Signed)
 Per Mitzie [phone]) at [facility], she went out to pt's home yesterday for a visit and stated pt had fallen, broke computer chair, has abrasions on legs, refused to go to the ER, can barely walk, smells of urine, empty beer cans were everywhere and is smoking marijuana daily.

## 2024-08-26 NOTE — Telephone Encounter (Signed)
 Left a message for the patient to call the office.

## 2024-08-27 ENCOUNTER — Inpatient Hospital Stay (HOSPITAL_COMMUNITY)
Admission: EM | Admit: 2024-08-27 | Discharge: 2024-09-02 | DRG: 617 | Disposition: A | Discharge status: Skilled Nursing Facility | Source: Ambulatory Visit | Attending: Family Medicine | Admitting: Family Medicine

## 2024-08-27 ENCOUNTER — Other Ambulatory Visit: Payer: Self-pay

## 2024-08-27 ENCOUNTER — Emergency Department (HOSPITAL_COMMUNITY)

## 2024-08-27 ENCOUNTER — Encounter (HOSPITAL_COMMUNITY): Payer: Self-pay | Admitting: *Deleted

## 2024-08-27 DIAGNOSIS — Z87442 Personal history of urinary calculi: Secondary | ICD-10-CM

## 2024-08-27 DIAGNOSIS — Z8673 Personal history of transient ischemic attack (TIA), and cerebral infarction without residual deficits: Secondary | ICD-10-CM

## 2024-08-27 DIAGNOSIS — Z950 Presence of cardiac pacemaker: Secondary | ICD-10-CM

## 2024-08-27 DIAGNOSIS — I129 Hypertensive chronic kidney disease with stage 1 through stage 4 chronic kidney disease, or unspecified chronic kidney disease: Secondary | ICD-10-CM | POA: Diagnosis present

## 2024-08-27 DIAGNOSIS — G621 Alcoholic polyneuropathy: Secondary | ICD-10-CM | POA: Diagnosis present

## 2024-08-27 DIAGNOSIS — E785 Hyperlipidemia, unspecified: Secondary | ICD-10-CM | POA: Diagnosis present

## 2024-08-27 DIAGNOSIS — I10 Essential (primary) hypertension: Secondary | ICD-10-CM | POA: Diagnosis present

## 2024-08-27 DIAGNOSIS — N183 Chronic kidney disease, stage 3 unspecified: Secondary | ICD-10-CM | POA: Diagnosis present

## 2024-08-27 DIAGNOSIS — M869 Osteomyelitis, unspecified: Principal | ICD-10-CM

## 2024-08-27 DIAGNOSIS — Z7982 Long term (current) use of aspirin: Secondary | ICD-10-CM

## 2024-08-27 DIAGNOSIS — I739 Peripheral vascular disease, unspecified: Secondary | ICD-10-CM | POA: Diagnosis present

## 2024-08-27 DIAGNOSIS — F1010 Alcohol abuse, uncomplicated: Secondary | ICD-10-CM | POA: Diagnosis present

## 2024-08-27 DIAGNOSIS — M86171 Other acute osteomyelitis, right ankle and foot: Secondary | ICD-10-CM | POA: Diagnosis present

## 2024-08-27 DIAGNOSIS — B952 Enterococcus as the cause of diseases classified elsewhere: Secondary | ICD-10-CM | POA: Diagnosis present

## 2024-08-27 DIAGNOSIS — E1122 Type 2 diabetes mellitus with diabetic chronic kidney disease: Secondary | ICD-10-CM | POA: Diagnosis present

## 2024-08-27 DIAGNOSIS — I2510 Atherosclerotic heart disease of native coronary artery without angina pectoris: Secondary | ICD-10-CM | POA: Diagnosis present

## 2024-08-27 DIAGNOSIS — M25561 Pain in right knee: Secondary | ICD-10-CM | POA: Diagnosis present

## 2024-08-27 DIAGNOSIS — Z79899 Other long term (current) drug therapy: Secondary | ICD-10-CM

## 2024-08-27 DIAGNOSIS — N400 Enlarged prostate without lower urinary tract symptoms: Secondary | ICD-10-CM | POA: Diagnosis present

## 2024-08-27 DIAGNOSIS — B964 Proteus (mirabilis) (morganii) as the cause of diseases classified elsewhere: Secondary | ICD-10-CM | POA: Diagnosis present

## 2024-08-27 DIAGNOSIS — M11261 Other chondrocalcinosis, right knee: Secondary | ICD-10-CM | POA: Diagnosis present

## 2024-08-27 DIAGNOSIS — L97519 Non-pressure chronic ulcer of other part of right foot with unspecified severity: Secondary | ICD-10-CM | POA: Diagnosis present

## 2024-08-27 DIAGNOSIS — F32A Depression, unspecified: Secondary | ICD-10-CM | POA: Diagnosis present

## 2024-08-27 DIAGNOSIS — Z9181 History of falling: Secondary | ICD-10-CM

## 2024-08-27 DIAGNOSIS — B9561 Methicillin susceptible Staphylococcus aureus infection as the cause of diseases classified elsewhere: Secondary | ICD-10-CM | POA: Diagnosis present

## 2024-08-27 DIAGNOSIS — E1169 Type 2 diabetes mellitus with other specified complication: Secondary | ICD-10-CM | POA: Diagnosis present

## 2024-08-27 DIAGNOSIS — R112 Nausea with vomiting, unspecified: Secondary | ICD-10-CM | POA: Diagnosis not present

## 2024-08-27 DIAGNOSIS — L03115 Cellulitis of right lower limb: Secondary | ICD-10-CM | POA: Diagnosis present

## 2024-08-27 DIAGNOSIS — Z7901 Long term (current) use of anticoagulants: Secondary | ICD-10-CM

## 2024-08-27 DIAGNOSIS — L02611 Cutaneous abscess of right foot: Secondary | ICD-10-CM | POA: Diagnosis present

## 2024-08-27 DIAGNOSIS — E876 Hypokalemia: Secondary | ICD-10-CM | POA: Diagnosis present

## 2024-08-27 DIAGNOSIS — I4821 Permanent atrial fibrillation: Secondary | ICD-10-CM | POA: Diagnosis present

## 2024-08-27 LAB — CBC WITH DIFFERENTIAL/PLATELET
Abs Immature Granulocytes: 0.14 K/uL — ABNORMAL HIGH (ref 0.00–0.07)
Basophils Absolute: 0.1 K/uL (ref 0.0–0.1)
Basophils Relative: 1 %
Eosinophils Absolute: 0.2 K/uL (ref 0.0–0.5)
Eosinophils Relative: 2 %
HCT: 53.4 % — ABNORMAL HIGH (ref 39.0–52.0)
Hemoglobin: 17.8 g/dL — ABNORMAL HIGH (ref 13.0–17.0)
Immature Granulocytes: 1 %
Lymphocytes Relative: 15 %
Lymphs Abs: 1.5 K/uL (ref 0.7–4.0)
MCH: 32.1 pg (ref 26.0–34.0)
MCHC: 33.3 g/dL (ref 30.0–36.0)
MCV: 96.4 fL (ref 80.0–100.0)
Monocytes Absolute: 1.2 K/uL — ABNORMAL HIGH (ref 0.1–1.0)
Monocytes Relative: 12 %
Neutro Abs: 6.9 K/uL (ref 1.7–7.7)
Neutrophils Relative %: 69 %
Platelets: 202 K/uL (ref 150–400)
RBC: 5.54 MIL/uL (ref 4.22–5.81)
RDW: 15.3 % (ref 11.5–15.5)
WBC: 10 K/uL (ref 4.0–10.5)
nRBC: 0 % (ref 0.0–0.2)

## 2024-08-27 LAB — LACTIC ACID, PLASMA: Lactic Acid, Venous: 2 mmol/L (ref 0.5–1.9)

## 2024-08-27 LAB — BASIC METABOLIC PANEL WITH GFR
Anion gap: 14 (ref 5–15)
BUN: 18 mg/dL (ref 8–23)
CO2: 25 mmol/L (ref 22–32)
Calcium: 9.3 mg/dL (ref 8.9–10.3)
Chloride: 101 mmol/L (ref 98–111)
Creatinine, Ser: 1.18 mg/dL (ref 0.61–1.24)
GFR, Estimated: >60 mL/min (ref >60)
Glucose, Bld: 100 mg/dL — ABNORMAL HIGH (ref 70–99)
Potassium: 3.2 mmol/L — ABNORMAL LOW (ref 3.5–5.1)
Sodium: 140 mmol/L (ref 135–145)

## 2024-08-27 MED ORDER — THIAMINE MONONITRATE 100 MG PO TABS
100.0000 mg | ORAL_TABLET | Freq: Every day | ORAL | Status: DC
  Administered 2024-08-27 – 2024-09-02 (×6): 100 mg via ORAL
  Filled 2024-08-27 (×6): qty 1

## 2024-08-27 MED ORDER — SODIUM CHLORIDE 0.9 % IV SOLN
3.0000 g | Freq: Once | INTRAVENOUS | Status: AC
  Administered 2024-08-27: 3 g via INTRAVENOUS
  Filled 2024-08-27: qty 8

## 2024-08-27 MED ORDER — GABAPENTIN 100 MG PO CAPS
200.0000 mg | ORAL_CAPSULE | Freq: Two times a day (BID) | ORAL | Status: DC
  Administered 2024-08-27 – 2024-09-02 (×12): 200 mg via ORAL
  Filled 2024-08-27 (×12): qty 2

## 2024-08-27 MED ORDER — ATORVASTATIN CALCIUM 40 MG PO TABS
40.0000 mg | ORAL_TABLET | Freq: Every day | ORAL | Status: DC
  Administered 2024-08-27 – 2024-09-01 (×6): 40 mg via ORAL
  Filled 2024-08-27 (×6): qty 1

## 2024-08-27 MED ORDER — ONDANSETRON HCL 4 MG/2ML IJ SOLN
4.0000 mg | Freq: Four times a day (QID) | INTRAMUSCULAR | Status: DC | PRN
  Administered 2024-08-31: 4 mg via INTRAVENOUS
  Filled 2024-08-27: qty 2

## 2024-08-27 MED ORDER — ESCITALOPRAM OXALATE 10 MG PO TABS
5.0000 mg | ORAL_TABLET | Freq: Every day | ORAL | Status: DC
  Administered 2024-08-28 – 2024-09-02 (×6): 5 mg via ORAL
  Filled 2024-08-27 (×6): qty 1

## 2024-08-27 MED ORDER — ENOXAPARIN SODIUM 80 MG/0.8ML IJ SOSY
1.0000 mg/kg | PREFILLED_SYRINGE | Freq: Two times a day (BID) | INTRAMUSCULAR | Status: DC
  Administered 2024-08-28: 80 mg via SUBCUTANEOUS
  Filled 2024-08-27: qty 0.8

## 2024-08-27 MED ORDER — THIAMINE HCL 100 MG/ML IJ SOLN
100.0000 mg | Freq: Every day | INTRAMUSCULAR | Status: DC
  Filled 2024-08-27 (×4): qty 2

## 2024-08-27 MED ORDER — HYDRALAZINE HCL 20 MG/ML IJ SOLN: 5.0000 mg | INTRAMUSCULAR | Status: DC | PRN

## 2024-08-27 MED ORDER — ACETAMINOPHEN 650 MG RE SUPP
650.0000 mg | Freq: Four times a day (QID) | RECTAL | Status: AC | PRN
Start: 2024-08-27 — End: ?

## 2024-08-27 MED ORDER — FOLIC ACID 1 MG PO TABS
1.0000 mg | ORAL_TABLET | Freq: Every day | ORAL | Status: DC
  Administered 2024-08-27 – 2024-09-02 (×7): 1 mg via ORAL
  Filled 2024-08-27 (×7): qty 1

## 2024-08-27 MED ORDER — LORAZEPAM 1 MG PO TABS: 1.0000 mg | ORAL_TABLET | ORAL | Status: AC | PRN

## 2024-08-27 MED ORDER — MORPHINE SULFATE (PF) 2 MG/ML IV SOLN
2.0000 mg | INTRAVENOUS | Status: DC | PRN
  Administered 2024-08-31: 2 mg via INTRAVENOUS
  Filled 2024-08-27: qty 1

## 2024-08-27 MED ORDER — AMLODIPINE BESYLATE 5 MG PO TABS
10.0000 mg | ORAL_TABLET | Freq: Every day | ORAL | Status: DC
  Administered 2024-08-28 – 2024-09-02 (×5): 10 mg via ORAL
  Filled 2024-08-27 (×5): qty 2

## 2024-08-27 MED ORDER — TAMSULOSIN HCL 0.4 MG PO CAPS
0.4000 mg | ORAL_CAPSULE | Freq: Every day | ORAL | Status: DC
  Administered 2024-08-27 – 2024-09-01 (×6): 0.4 mg via ORAL
  Filled 2024-08-27 (×6): qty 1

## 2024-08-27 MED ORDER — LINEZOLID 600 MG PO TABS
600.0000 mg | ORAL_TABLET | Freq: Once | ORAL | Status: AC
  Administered 2024-08-27: 600 mg via ORAL
  Filled 2024-08-27: qty 1

## 2024-08-27 MED ORDER — ACETAMINOPHEN 325 MG PO TABS: 650.0000 mg | ORAL_TABLET | Freq: Four times a day (QID) | ORAL | Status: DC | PRN

## 2024-08-27 MED ORDER — LORAZEPAM 2 MG/ML IJ SOLN: 1.0000 mg | INTRAMUSCULAR | Status: AC | PRN

## 2024-08-27 MED ORDER — LACTATED RINGERS IV SOLN: INTRAVENOUS | Status: AC

## 2024-08-27 MED ORDER — SODIUM CHLORIDE 0.9 % IV SOLN
3.0000 g | Freq: Four times a day (QID) | INTRAVENOUS | Status: DC
  Administered 2024-08-28 – 2024-08-31 (×14): 3 g via INTRAVENOUS
  Filled 2024-08-27 (×14): qty 8

## 2024-08-27 MED ORDER — POTASSIUM CHLORIDE CRYS ER 20 MEQ PO TBCR
40.0000 meq | EXTENDED_RELEASE_TABLET | Freq: Once | ORAL | Status: AC
  Administered 2024-08-27: 40 meq via ORAL
  Filled 2024-08-27: qty 2

## 2024-08-27 MED ORDER — LOSARTAN POTASSIUM 50 MG PO TABS
50.0000 mg | ORAL_TABLET | Freq: Every day | ORAL | Status: DC
  Administered 2024-08-28 – 2024-09-02 (×6): 50 mg via ORAL
  Filled 2024-08-27 (×6): qty 1

## 2024-08-27 MED ORDER — OXYCODONE HCL 5 MG PO TABS
5.0000 mg | ORAL_TABLET | ORAL | Status: DC | PRN
  Administered 2024-08-29 – 2024-08-30 (×2): 5 mg via ORAL
  Filled 2024-08-27 (×2): qty 1

## 2024-08-27 MED ORDER — HYDRALAZINE HCL 25 MG PO TABS
25.0000 mg | ORAL_TABLET | Freq: Two times a day (BID) | ORAL | Status: DC
  Administered 2024-08-27 – 2024-09-02 (×11): 25 mg via ORAL
  Filled 2024-08-27 (×12): qty 1

## 2024-08-27 MED ORDER — ADULT MULTIVITAMIN W/MINERALS CH
1.0000 | ORAL_TABLET | Freq: Every day | ORAL | Status: DC
  Administered 2024-08-27 – 2024-09-02 (×7): 1 via ORAL
  Filled 2024-08-27 (×7): qty 1

## 2024-08-27 MED ORDER — LINEZOLID 600 MG PO TABS
600.0000 mg | ORAL_TABLET | Freq: Two times a day (BID) | ORAL | Status: DC
  Administered 2024-08-28 – 2024-09-02 (×11): 600 mg via ORAL
  Filled 2024-08-27 (×11): qty 1

## 2024-08-27 MED ORDER — ONDANSETRON HCL 4 MG PO TABS
4.0000 mg | ORAL_TABLET | Freq: Four times a day (QID) | ORAL | Status: DC | PRN
  Administered 2024-08-30: 4 mg via ORAL
  Filled 2024-08-27: qty 1

## 2024-08-27 MED ORDER — ALBUTEROL SULFATE (2.5 MG/3ML) 0.083% IN NEBU: 2.5000 mg | INHALATION_SOLUTION | RESPIRATORY_TRACT | Status: DC | PRN

## 2024-08-27 NOTE — ED Provider Notes (Signed)
 [place] EMERGENCY DEPARTMENT AT [facility] Provider Note   CSN: [id number] Arrival date & time: 08/27/24  1351     Patient presents with: Wound Check   James Gillespie is a 73 y.o. male.  He of neuropathy, chronic foot wounds with prior amputation.  Presents the ER today complaining of right foot infection that has been worsening.  He follows up with Dr. [name] and states he was told to come up here and have the toe amputated.  He denies fever or chills, no nausea or vomiting, he states that due to the neuropathy he is not having pain.  He did have a large amount of drainage from the foot yesterday.  He had prior in office debridement and has packing in place.  Also notes that he tripped and fell on his right knee couple days ago and is having some pain but denies inability ambulate swelling.   Dr. [name] called and provided some additional history.  He states that the patient Refused admission up until this point, had debridement in office and just finished Augmentin due to not tolerating doxycyline. Xray on plain films shows osteomyelitis in the office per Dr. [name].    Wound Check       Prior to Admission medications   Medication Sig Start Date End Date Taking? Authorizing Provider  amLODipine (NORVASC) 10 MG tablet Take 10 mg by mouth daily.    [provider]  amoxicillin-clavulanate (AUGMENTIN) 500-125 MG tablet Take 1 tablet by mouth 2 (two) times daily.    [provider]  aspirin EC 81 MG tablet Take 81 mg by mouth daily.     [provider]  atorvastatin (LIPITOR) 80 MG tablet Take 40 mg by mouth at bedtime.     [provider]  cloNIDine (CATAPRES) 0.1 MG tablet Take 0.1 mg by mouth 2 (two) times daily. 04/23/20   [provider]  [employer] VITAMIN B12 1000 MCG tablet Take 2,000 mcg by mouth daily.    [provider]  ERTAPENEM SODIUM IV Inject 500 mg into the vein at bedtime.    [provider]   escitalopram (LEXAPRO) 5 MG tablet Take 5 mg by mouth daily. 08/22/24   [provider]  fluconazole (DIFLUCAN) 150 MG tablet Take 150 mg by mouth every Monday. 04/23/20   [provider]  furosemide (LASIX) 20 MG tablet Take 20 mg by mouth daily. 08/22/24   [provider]  gabapentin (NEURONTIN) 100 MG capsule Take 200 mg by mouth 2 (two) times daily. 08/22/24   [provider]  hydrALAZINE (APRESOLINE) 50 MG tablet Take 50 mg by mouth 3 (three) times daily.    [provider]  levofloxacin (LEVAQUIN) 500 MG tablet Take 500 mg by mouth daily.    [provider]  Liniments (DEEP BLUE RELIEF EX) Apply 1 application topically daily as needed (pain).    [provider]  losartan (COZAAR) 50 MG tablet Take 50 mg by mouth daily. 08/22/24   [provider]  Menthol, Topical Analgesic, (BENGAY EX) Apply 1 application topically daily as needed (pain).    [provider]  Multiple Vitamin (MULTIVITAMIN WITH MINERALS) TABS tablet Take 1 tablet by mouth daily. Centrum Silver    [provider]  Probiotic CAPS Take 1 capsule by mouth every other day.    [provider]  tamsulosin (FLOMAX) 0.4 MG CAPS capsule Take 0.4 mg by mouth daily. 08/22/24   [provider]  GRAYLAND 15  MG TABS tablet Take 15 mg by mouth daily.    [provider]    Allergies: Patient has no known allergies.    Review of Systems  Updated Vital Signs BP 116/74   Pulse 67   Temp (!) 97.2 F (36.2 C)   Resp 16   Ht 5' 11 (1.803 m)   Wt 78.9 kg   SpO2 97%   BMI 24.27 kg/m   Physical Exam Vitals and nursing note reviewed.  Constitutional:      General: He is not in acute distress.    Appearance: He is well-developed.  HENT:     Head: Normocephalic and atraumatic.  Eyes:     Conjunctiva/sclera: Conjunctivae normal.  Cardiovascular:     Rate and Rhythm: Normal rate and regular rhythm.     Heart sounds:  No murmur heard. Pulmonary:     Effort: Pulmonary effort is normal. No respiratory distress.     Breath sounds: Normal breath sounds.  Abdominal:     Palpations: Abdomen is soft.     Tenderness: There is no abdominal tenderness.  Musculoskeletal:        General: No swelling.     Cervical back: Neck supple.     Comments: Right DP and PT pulses are intact  Skin:    General: Skin is warm and dry.     Capillary Refill: Capillary refill takes less than 2 seconds.     Comments: Erythema to the great toe with wound to the dorsum of the right great toe that is currently packed, no active drainage, there is an ulceration to the plantar aspect of the foot over the first MTP joint.  Neurological:     General: No focal deficit present.     Mental Status: He is alert and oriented to person, place, and time.  Psychiatric:        Mood and Affect: Mood normal.     (all labs ordered are listed, but only abnormal results are displayed) Labs Reviewed  CBC WITH DIFFERENTIAL/PLATELET - Abnormal; Notable for the following components:      Result Value   Hemoglobin 17.8 (*)    HCT 53.4 (*)    Monocytes Absolute 1.2 (*)    Abs Immature Granulocytes 0.14 (*)    All other components within normal limits  BASIC METABOLIC PANEL WITH GFR - Abnormal; Notable for the following components:   Potassium 3.2 (*)    Glucose, Bld 100 (*)    All other components within normal limits  CULTURE, BLOOD (ROUTINE X 2)  CULTURE, BLOOD (ROUTINE X 2)  LACTIC ACID, PLASMA    EKG: None  Radiology: No results found.   Procedures   Medications Ordered in the ED  Ampicillin -Sulbactam (UNASYN ) 3 g in sodium chloride  0.9 % 100 mL IVPB (has no administration in time range)    And  linezolid  (ZYVOX ) tablet 600 mg (has no administration in time range)                                    Medical Decision Making This patient presents to the ED for concern of right foot wound, this involves an extensive number of  treatment options, and is a complaint that carries with it a high risk of complications and morbidity.  The differential diagnosis includes abscess, cellulitis, osteomyelitis   Co morbidities that complicate the patient evaluation :   Neuropathy, osteomyelitis  Additional history obtained:  Additional history obtained from EMR External records from outside source obtained and reviewed including CT of the right foot from August 07, 2024, prior notes   Lab Tests:  I Ordered, and personally interpreted labs.  The pertinent results include:  CBC with no leukocytosis or anemia, BMP with mild hypokalemia, lactic acid slightly elevated at 2    Imaging Studies ordered:  I ordered imaging studies including Xray right foot which shows ulceration  of the ball of foot with concern for osteomyelitis at the base of the proximal phalanx of the first digit X-ray right knee shows no fracture or dislocation I independently visualized and interpreted imaging within scope of identifying emergent findings  I agree with the radiologist interpretation   Consultations Obtained:  I requested consultation with the podiatrist Dr. [name],  and discussed lab and imaging findings as well as pertinent plan - they recommend: Admit patient to hospitalist service for amputation on Friday, request no anticoagulation prior to surgery.  Will see patient in the morning.   Problem List / ED Course / Critical interventions / Medication management  Right foot infection-patient has had outpatient antibiotics and in office debridement without relief and is now agreeable to amputation.  He was given IV antibiotics, replete his potassium, x-ray did show changes of osteomyelitis, admitted to hospitalist for amputation with Dr. [name] on Friday.  Patient was agreeable to plan of care.  He did not meet sepsis criteria. I have reviewed the patients home medicines and have made adjustments as needed     Amount and/or  Complexity of Data Reviewed Labs: ordered. Radiology: ordered.  Risk Prescription drug management. Decision regarding hospitalization.        Final diagnoses:  None    ED Discharge Orders     None          Suellen Sherran DELENA DEVONNA 08/27/24 WELTON Suzette Pac, MD 08/28/24 1302

## 2024-08-27 NOTE — ED Triage Notes (Signed)
 Pt with wound to right foot, sent here for admission to have an amputation to rt great toe.

## 2024-08-27 NOTE — Consult Note (Signed)
 PHARMACY - ANTICOAGULATION CONSULT NOTE  Pharmacy Consult for Enoxaparin Indication: atrial fibrillation  No Known Allergies  Patient Measurements: Height: 5' 11 (180.3 cm) Weight: 78.9 kg (174 lb) IBW/kg (Calculated) : 75.3 HEPARIN  DW (KG): 78.9  Vital Signs: Temp: 97.7 F (36.5 C) (11/19 2025) Temp Source: Oral (11/19 2025) BP: 174/93 (11/19 2025) Pulse Rate: 60 (11/19 2025)  Labs: Recent Labs    08/27/24 1652  HGB 17.8*  HCT 53.4*  PLT 202  CREATININE 1.18    Estimated Creatinine Clearance: 58.5 mL/min (by C-G formula based on SCr of 1.18 mg/dL).   Medical History: Past Medical History:  Diagnosis Date   Arthritis    Chronic kidney disease    History of gout    History of kidney stones    Hypertension    Neuropathy    Pre-diabetes    Stroke (HCC) 2017   No deficits    Medications:  Xarelto - last dose 11/19 @ 7am   Assessment: Patient admitted with wound infection with plan surgery for Friday 11/21. Pharmacy consulted to dose enoxaparin for 1 day and stop evening prior to surgery (see consult for details).  Plan:  Start enoxaparin 1mg /kg q 12 hours. Will order 1st dose 24 hours after last Xarelto dose. Duration 2 doses (24 hours) in preparation for surgery on Friday 11/21.   Karion Cudd Rodriguez-Guzman PharmD, BCPS 08/27/2024 8:33 PM

## 2024-08-27 NOTE — H&P (Signed)
 [facility] H&P   Patient Demographics:    James Gillespie, is a 73 y.o. male  MRN: [id number]   DOB - May 04, 1951  Admit Date - 08/27/2024  Outpatient Primary MD for the patient is Pallone, Alan ORN, MD  Referring MD/NP/PA: PA Celeste  Outpatient Specialists: Podiatry  Dr. [name]  Patient coming from: Podiatry office  Chief Complaint  Patient presents with   Wound Check      HPI:    [name]  is a 73 y.o. 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 - Patient presents to ED per podiatry instructions Dr. [name], patient with known right foot infection, which has been worsening, neuropathic, does not feel pain, he has been having large amount of drainage from his foot, had prior debridement in the office with packing in place, he was instructed to come to ED by his podiatrist as a need of amputation, refusing until this point, had debridement in the office, and he finished Augmentin and doxycycline without much improvement, x-ray in the office showing osteomyelitis, so patient was directed to come to ED. - In ED Significant for hypokalemia 3.2, lactic acid of 2, cultures were sent, patient was started on IV Unasyn and linezolid and [facility] consulted to admit with anticipation of first ray amputation on Friday.    Review of systems:     A full 10 point Review of Systems was done, except as stated above, all other Review of Systems were negative.   With Past History of the following :    Past Medical History:  Diagnosis Date   Arthritis    Chronic kidney disease    History of gout    History of kidney stones    Hypertension    Neuropathy    Pre-diabetes    Stroke (HCC) 2017   No deficits      Past Surgical History:  Procedure Laterality Date   AMPUTATION Right 02/12/2020    Procedure: PARTIAL AMPUTATION DIGIT SECOND TOE RIGHT FOOT;  Surgeon: [name] Katz, DPM;  Location: [facility];  Service: Podiatry;  Laterality: Right;   BACK SURGERY     lumbar disc   CARPAL TUNNEL RELEASE Left    HEMORRHOID SURGERY     HERNIA REPAIR Left    Inguinal   INCISION AND DRAINAGE OF WOUND Right 12/03/2019   Procedure: DEBRIDEMENT ULCERATION 3rd METATARSAL RIGHT FOOT;  Surgeon: [name] Katz, DPM;  Location: [facility];  Service: Podiatry;  Laterality: Right;   LITHOTRIPSY     METATARSAL HEAD EXCISION Left 01/16/2018   Procedure: 5TH METATARSAL HEAD RESECTION;  Surgeon: [name] Katz, DPM;  Location: [facility];  Service: Podiatry;  Laterality: Left;   OSTECTOMY Right 05/12/2020   Procedure: 5TH METATARSAL HEAD RESECTION;  Surgeon: [name] Katz, DPM;  Location: [facility];  Service: Podiatry;  Laterality: Right;  WEIL OSTEOTOMY Right 11/21/2017   Procedure: WEIL OSTEOTOMY RIGHT 4TH METATARSAL;  Surgeon: [name] Katz, DPM;  Location: [facility];  Service: Podiatry;  Laterality: Right;   WEIL OSTEOTOMY Right 12/03/2019   Procedure: WEIL OSTEOTOMY 3rd METATARSAL FOOT;  Surgeon: [name] Katz, DPM;  Location: [facility];  Service: Podiatry;  Laterality: Right;   WOUND DEBRIDEMENT Right 11/21/2017   Procedure: DEBRIDEMENT ULCERATION RIGHT FOOT;  Surgeon: [name] Katz, DPM;  Location: [facility];  Service: Podiatry;  Laterality: Right;   WOUND DEBRIDEMENT Left 01/16/2018   Procedure: DEBRIDEMENT ULCERATION LEFT FOOT;  Surgeon: [name] Katz, DPM;  Location: [facility];  Service: Podiatry;  Laterality: Left;   WOUND DEBRIDEMENT Right 05/12/2020   Procedure: DEBRIDEMENT ULCERATION;  Surgeon: [name] Katz, DPM;  Location: [facility];  Service: Podiatry;  Laterality: Right;      Social History:     Social History   Tobacco Use   Smoking status: Never   Smokeless tobacco: Never  Substance Use Topics   Alcohol use: Yes    Alcohol/week: 35.0 standard drinks of alcohol     Types: 35 Standard drinks or equivalent per week    Comment: 5 per day        Family History :    History reviewed. No pertinent family history.    Home Medications:   Prior to Admission medications   Medication Sig Start Date End Date Taking? Authorizing Provider  amLODipine (NORVASC) 10 MG tablet Take 10 mg by mouth daily.   Yes [provider]  atorvastatin (LIPITOR) 80 MG tablet Take 40 mg by mouth at bedtime.    Yes [provider]  [facility] VITAMIN B12 1000 MCG tablet Take 2,000 mcg by mouth daily.   Yes [provider]  escitalopram (LEXAPRO) 5 MG tablet Take 5 mg by mouth daily. 08/22/24  Yes [provider]  furosemide (LASIX) 20 MG tablet Take 20 mg by mouth daily. 08/22/24  Yes [provider]  gabapentin (NEURONTIN) 100 MG capsule Take 200 mg by mouth 2 (two) times daily. 08/22/24  Yes [provider]  hydrALAZINE (APRESOLINE) 25 MG tablet Take 25 mg by mouth in the morning and at bedtime.   Yes [provider]  Liniments (DEEP BLUE RELIEF EX) Apply 1 application topically daily as needed (pain).   Yes [provider]  losartan (COZAAR) 50 MG tablet Take 50 mg by mouth daily. 08/22/24  Yes [provider]  Menthol, Topical Analgesic, (BENGAY EX) Apply 1 application topically daily as needed (pain).   Yes [provider]  Probiotic CAPS Take 1 capsule by mouth every other day.   Yes [provider]  tamsulosin (FLOMAX) 0.4 MG CAPS capsule Take 0.4 mg by mouth daily after supper. 08/22/24  Yes [provider]  XARELTO 15 MG TABS tablet Take 15 mg by mouth daily.   Yes [provider]     Allergies:    No Known Allergies   Physical Exam:   Vitals  Blood pressure (!) 182/84, pulse 62, temperature (!) 97.3 F (36.3 C), temperature source Oral, resp. rate 17, height 5' 11 (1.803 m), weight 78.9 kg, SpO2 97%.   1. General Well-developed male, laying in bed,  no apparent distress  2. Normal affect and insight, Not Suicidal or Homicidal, Awake Alert, Oriented X 3.  3. No F.N deficits, ALL C.Nerves Intact, Strength 5/5 all 4 extremities, Sensation intact all 4 extremities, Plantars down going.  4. Ears and Eyes appear Normal, Conjunctivae clear, PERRLA.  Moist Oral Mucosa.  5. Supple Neck, No JVD, No cervical lymphadenopathy appriciated, No Carotid Bruits.  6. Symmetrical Chest wall movement, Good air movement bilaterally, CTAB.  7. RRR, No Gallops, Rubs or Murmurs, No Parasternal Heave.  8. Positive Bowel Sounds, Abdomen Soft, No tenderness, No organomegaly appriciated,No rebound -guarding or rigidity.  9.  Right foot with significant great toe swelling, discharge, packed with failure ulcer, please see picture below    Data Review:    CBC Recent Labs  Lab 08/27/24 1652  WBC 10.0  HGB 17.8*  HCT 53.4*  PLT 202  MCV 96.4  MCH 32.1  MCHC 33.3  RDW 15.3  LYMPHSABS 1.5  MONOABS 1.2*  EOSABS 0.2  BASOSABS 0.1   ------------------------------------------------------------------------------------------------------------------  Chemistries  Recent Labs  Lab 08/27/24 1652  NA 140  K 3.2*  CL 101  CO2 25  GLUCOSE 100*  BUN 18  CREATININE 1.18  CALCIUM 9.3   ------------------------------------------------------------------------------------------------------------------ estimated creatinine clearance is 58.5 mL/min (by C-G formula based on SCr of 1.18 mg/dL). ------------------------------------------------------------------------------------------------------------------ No results for input(s): TSH, T4TOTAL, T3FREE, THYROIDAB in the last 72 hours.  Invalid input(s): FREET3  Coagulation profile No results for input(s): INR, PROTIME in the last 168 hours. ------------------------------------------------------------------------------------------------------------------- No results for input(s): DDIMER in  the last 72 hours. -------------------------------------------------------------------------------------------------------------------  Cardiac Enzymes No results for input(s): CKMB, TROPONINI, MYOGLOBIN in the last 168 hours.  Invalid input(s): CK ------------------------------------------------------------------------------------------------------------------ No results found for: BNP   ---------------------------------------------------------------------------------------------------------------  Urinalysis No results found for: COLORURINE, APPEARANCEUR, LABSPEC, PHURINE, GLUCOSEU, HGBUR, BILIRUBINUR, KETONESUR, PROTEINUR, UROBILINOGEN, NITRITE, LEUKOCYTESUR  ----------------------------------------------------------------------------------------------------------------   Imaging Results:    DG Knee Complete 4 Views Right Result Date: 08/27/2024 CLINICAL DATA:  Infection EXAM: RIGHT KNEE - COMPLETE 4+ VIEW COMPARISON:  None Available. FINDINGS: No evidence of fracture, dislocation, or joint effusion. 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 Soft tissues are unremarkable. IMPRESSION: 1. No acute fracture or dislocation. 2. Medial and lateral compartment chondrocalcinosis. Electronically Signed   By: Greig Pique M.D.   On: 08/27/2024 18:17   DG Foot 2 Views Right Result Date: 08/27/2024 EXAM: 1 or 2 VIEW(S) XRAY OF THE FOOT 08/27/2024 05:34:53 PM COMPARISON: None available. CLINICAL HISTORY: infection FINDINGS: BONES AND JOINTS: Screw within third Metatarsal head noted. Second Digit amputation at level of MTP joint. Chronic deformity of third through fifth Digits. Query erosion along plantar aspect of first Digit Proximal Phalanx base with concern for osteomyelitis. Degenerative changes of first Digit Metatarsophalangeal joint. SOFT TISSUES: Subcutaneous soft tissue edema of forefoot. Vascular calcifications. Ulceration over the ball of  the foot. IMPRESSION: 1. Ulceration over the ball of the foot with concern for osteomyelitis at the base of the proximal phalanx of the first digit. 2. Second digit amputation at the level of the MTP joint. 3. Screw within the third metatarsal head. Electronically signed by: Norleen Boxer MD 08/27/2024 06:11 PM EST RP Workstation: [id number]     EKG: Paced rhythm Vent. rate 61 BPM PR interval 222 ms QRS duration 148 ms QT/QTcB 489/493 ms P-R-T axes 0 -48 110 Sinus rhythm Prolonged PR interval LVH with IVCD, LAD and secondary repol abnrm Borderline prolonged QT interval   Assessment & Plan:    Principal Problem:   Acute osteomyelitis of right foot (HCC) Active Problems:   History of CVA (cerebrovascular accident)   PAD (peripheral artery disease)   Hyperlipidemia   Essential hypertension  Right foot osteomyelitis - With known history of chronic right foot ulcer, sent by podiatry office given worsening  wound and discharge . - X-ray significant for osteomyelitis. - Continue with IV antibiotics (IV Unasyn and p.o. linezolid), follow-up blood cultures. - Plan per podiatry for first ray amputation on Friday.   Hyperlipidemia - Continue with atorvastatin  Neuropathic pain - Continue with gabapentin  Alcohol abuse - Will start on CIWA protocol  Depression - Continue with Lexapro  BPH - Continue with tamsulosin  Hypertension - Continue with losartan  Hypokalemia - replaced  Permanent A-fib A-fib severe bradycardia -Status post single-chamber pacemaker 08/28/2022 - On Xarelto for anticoagulation, request per podiatry to hold in anticipation of surgery-keep on Lovenox full dose in anticipation of surgery full anticoagulation, this can be held the night before surgery  History of CAD PVD CVA - He is supposed to be on aspirin, but not taking it so for now we will hold in anticipation of surgery, this can be resumed after surgery     DVT Prophylaxis Xarelto  AM Labs  Ordered, also please review Full Orders  Family Communication: Admission, patients condition and plan of care including tests being ordered have been discussed with the patient who indicate understanding and agree with the plan and Code Status.  Code Status full code  Likely DC to home   Consults called: Dr. Dr. [name] aware of the patient  Admission status: Inpatient  Time spent in minutes : 70 minutes   Brayton Lye M.D on 08/27/2024 at 7:21 PM   [facility] - Office  [phone]

## 2024-08-27 NOTE — Plan of Care (Signed)

## 2024-08-27 NOTE — ED Notes (Signed)
 See triage notes. Pt states has also fallen multiple times in last few days and c/o pain from upper right leg down. Pt moving all extremities. Pt has strong body odor. Pt states Dr [name] sent him here. Feet wrapped and pa now at bedside.

## 2024-08-27 NOTE — ED Notes (Signed)
 Pt changed into gown. Right leg from mid calf down slightly more swollen than left leg. Right foot swelling noted. Pedal pulses strong. No obvious deformities noted to right leg. Mild bruising noted to right knee.

## 2024-08-28 DIAGNOSIS — M86171 Other acute osteomyelitis, right ankle and foot: Secondary | ICD-10-CM | POA: Diagnosis not present

## 2024-08-28 LAB — CBC
HCT: 48.1 % (ref 39.0–52.0)
Hemoglobin: 16 g/dL (ref 13.0–17.0)
MCH: 32.5 pg (ref 26.0–34.0)
MCHC: 33.3 g/dL (ref 30.0–36.0)
MCV: 97.6 fL (ref 80.0–100.0)
Platelets: 202 K/uL (ref 150–400)
RBC: 4.93 MIL/uL (ref 4.22–5.81)
RDW: 15.5 % (ref 11.5–15.5)
WBC: 6.9 K/uL (ref 4.0–10.5)
nRBC: 0 % (ref 0.0–0.2)

## 2024-08-28 LAB — BASIC METABOLIC PANEL WITH GFR
Anion gap: 11 (ref 5–15)
BUN: 16 mg/dL (ref 8–23)
CO2: 27 mmol/L (ref 22–32)
Calcium: 8.9 mg/dL (ref 8.9–10.3)
Chloride: 105 mmol/L (ref 98–111)
Creatinine, Ser: 1.16 mg/dL (ref 0.61–1.24)
GFR, Estimated: >60 mL/min (ref >60)
Glucose, Bld: 84 mg/dL (ref 70–99)
Potassium: 4.1 mmol/L (ref 3.5–5.1)
Sodium: 142 mmol/L (ref 135–145)

## 2024-08-28 LAB — PREALBUMIN: Prealbumin: 11 mg/dL — ABNORMAL LOW (ref 18–38)

## 2024-08-28 LAB — HEMOGLOBIN A1C
Hgb A1c MFr Bld: 5.8 % — ABNORMAL HIGH (ref 4.8–5.6)
Mean Plasma Glucose: 119.76 mg/dL

## 2024-08-28 LAB — C-REACTIVE PROTEIN: CRP: 9.2 mg/dL — ABNORMAL HIGH (ref <1.0)

## 2024-08-28 LAB — SEDIMENTATION RATE: Sed Rate: 43 mm/h — ABNORMAL HIGH (ref 0–20)

## 2024-08-28 MED ORDER — LIDOCAINE 5 % EX PTCH
1.0000 | MEDICATED_PATCH | CUTANEOUS | Status: DC
  Administered 2024-08-28 – 2024-09-01 (×5): 1 via TRANSDERMAL
  Filled 2024-08-28 (×5): qty 1

## 2024-08-28 MED ORDER — JUVEN PO PACK
1.0000 | PACK | Freq: Two times a day (BID) | ORAL | Status: DC
  Administered 2024-08-28 – 2024-09-02 (×11): 1 via ORAL
  Filled 2024-08-28 (×9): qty 1

## 2024-08-28 MED ORDER — ENSURE PLUS HIGH PROTEIN PO LIQD
237.0000 mL | Freq: Two times a day (BID) | ORAL | Status: DC
  Administered 2024-08-28 – 2024-09-01 (×8): 237 mL via ORAL

## 2024-08-28 NOTE — Discharge Instructions (Addendum)
[facility] Stay Proper nutrition can help your body recover from illness and injury.   Foods and beverages high in protein, vitamins, and minerals help rebuild muscle loss, promote healing, & reduce fall risk.   In addition to eating healthy foods, a nutrition shake is an easy, delicious way to get the nutrition you need during and after your hospital stay  It is recommended that you continue to drink 2 bottles per day of:       Ensure Plus for at least 1 month (30 days) after your hospital stay   Tips for adding a nutrition shake into your routine: As allowed, drink one with vitamins or medications instead of water or juice Enjoy one as a tasty mid-morning or afternoon snack Drink cold or make a milkshake out of it Drink one instead of milk with cereal or snacks Use as a coffee creamer   Available at the following grocery stores and pharmacies:           * [place] [phone]            For COUPONS visit: www.ensure.com/join or [email or web address]   Suggested Substitutions Ensure Plus = Boost Plus = Carnation Breakfast Essentials = Boost Compact

## 2024-08-28 NOTE — Anesthesia Preprocedure Evaluation (Addendum)
 Anesthesia Evaluation  Patient identified by MRN, date of birth, ID band Patient awake    Reviewed: Allergy & Precautions, NPO status , Patient's Chart, lab work & pertinent test results  History of Anesthesia Complications Negative for: history of anesthetic complications  Airway Mallampati: II  TM Distance: >3 FB Neck ROM: Full    Dental  (+) Teeth Intact, Dental Advisory Given, Caps   Pulmonary    Pulmonary exam normal breath sounds clear to auscultation       Cardiovascular hypertension, Pt. on medications + Past MI and + Peripheral Vascular Disease  Normal cardiovascular exam+ dysrhythmias Atrial Fibrillation + pacemaker  Rhythm:Regular Rate:Normal     Neuro/Psych  Neuromuscular disease CVA, Residual Symptoms  negative psych ROS   GI/Hepatic negative GI ROS,,,(+)     substance abuse  alcohol use  Endo/Other  diabetes, Type 2    Renal/GU Renal diseaseStage 3 CKD     Musculoskeletal  (+) Arthritis , Osteoarthritis,    Abdominal   Peds  Hematology negative hematology ROS (+)   Anesthesia Other Findings   Reproductive/Obstetrics                              Anesthesia Physical Anesthesia Plan  ASA: 3  Anesthesia Plan: General   Post-op Pain Management: Dilaudid  IV   Induction: Intravenous  PONV Risk Score and Plan: Ondansetron  and Dexamethasone  Airway Management Planned: LMA  Additional Equipment: None  Intra-op Plan:   Post-operative Plan:   Informed Consent: I have reviewed the patients History and Physical, chart, labs and discussed the procedure including the risks, benefits and alternatives for the proposed anesthesia with the patient or authorized representative who has indicated his/her understanding and acceptance.     Dental advisory given  Plan Discussed with: CRNA and Surgeon  Anesthesia Plan Comments:          Anesthesia Quick Evaluation

## 2024-08-28 NOTE — TOC Initial Note (Signed)
 Transition of Care [facility]) - Initial/Assessment Note    Patient Details  Name: James Gillespie MRN: [id number] Date of Birth: [birth year]  [facility]) CM/SW Contact:    James DELENA Bigness, LCSW Phone Number: 08/28/2024, 11:20 AM  Clinical Narrative:                 Pt from home alone. Pt shares he currently has a HHRN for [facility] but, is unsure of the HHA who provides this service. Pt reports using RW when at home and a cane when ambulating in the community.  CSW discussed alcohol use with pt. Pt endorses drinking liquor and beer on a daily basis. Pt shares he started drinking again around 2 years ago after having 3-4 years of sobriety. Pt denies trigger causing him to start drinking again and denies having support during his time of sobriety. Pt reports having several falls in the past week but, denies alcohol playing a role in these falls. Pt is pre-contemplative stage of change and shares that alcohol is the only thing keeping him going. Pt reports he does not drink any other fluids. States he has tried to drink water in the past but does not like it. Pt is not interested in cutting back or trying to stop drinking and declines to have resources placed on his chart.  Pt is to have surgery tomorrow for amputation. Pt recommended for SNF following hospital stay. Pt agreeable to this plan and prefers placement in [place] or if goes to SNF in [place] does not want placement at [facility]. PASRR requested and is currently pending. Referrals for SNF have been faxed out.   Expected Discharge Plan: Skilled Nursing Facility Barriers to Discharge: Continued Medical Work up, SNF Pending bed offer   Patient Goals and CMS Choice Patient states their goals for this hospitalization and ongoing recovery are:: To get better CMS Medicare.gov Compare Post Acute Care list provided to:: Patient Choice offered to / list presented to : Patient [place] ownership interest in [facility].provided to::  Patient    Expected Discharge Plan and Services In-house Referral: Clinical Social Work Discharge Planning Services: NA Post Acute Care Choice: Skilled Nursing Facility Living arrangements for the past 2 months: Single Family Home                 DME Arranged: N/A DME Agency: NA                  Prior Living Arrangements/Services Living arrangements for the past 2 months: Single Family Home Lives with:: Self Patient language and need for interpreter reviewed:: Yes Do you feel safe going back to the place where you live?: Yes      Need for Family Participation in Patient Care: No (Comment) Care giver support system in place?: Yes (comment) Current home services: DME, Home RN Elene, RW) Criminal Activity/Legal Involvement Pertinent to Current Situation/Hospitalization: No - Comment as needed  Activities of Daily Living   ADL Screening (condition at time of admission) Independently performs ADLs?: Yes (appropriate for developmental age) Is the patient deaf or have difficulty hearing?: No Does the patient have difficulty seeing, even when wearing glasses/contacts?: No Does the patient have difficulty concentrating, remembering, or making decisions?: No  Permission Sought/Granted Permission sought to share information with : Facility Industrial/product Designer granted to share information with : Yes, Verbal Permission Granted     Permission granted to share info w AGENCY: SNF's        Emotional  Assessment Appearance:: Appears stated age Attitude/Demeanor/Rapport: Engaged Affect (typically observed): Appropriate Orientation: : Oriented to Self, Oriented to Place, Oriented to  Time, Oriented to Situation Alcohol / Substance Use: Illicit Drugs, Alcohol Use Psych Involvement: No (comment)  Admission diagnosis:  Acute osteomyelitis of right foot (HCC) [M86.171] Osteomyelitis of right foot, unspecified type [facility]) [M86.9] Patient Active Problem List   Diagnosis  Date Noted   Acute osteomyelitis of right foot (HCC) 08/27/2024   History of CVA (cerebrovascular accident) 08/27/2024   PAD (peripheral artery disease) 08/27/2024   Hyperlipidemia 08/27/2024   Essential hypertension 08/27/2024   PCP:  [name], MD Pharmacy:   [facility] [id number] - [place], [place] - [address] [place] [address] Phone: [phone] Fax: [phone]  [id number] - [place], [place] - [address] [place] [address] Phone: [phone] Fax: [phone]     Social Drivers of Health (SDOH) Social History: SDOH Screenings   Food Insecurity: No Food Insecurity (02/14/2024)   Received from [facility]  Housing: Low Risk  (02/14/2024)   Received [facility]  Transportation Needs: No Transportation Needs (04/11/2024)   Received from [facility]  Utilities: Not At Risk (02/14/2024)   Received from [facility] Resource Strain: Low Risk  (02/14/2024)   Received from [facility]  Physical Activity: Inactive (02/14/2024)   Received from [facility]  Social Connections: Moderately Isolated (02/14/2024)   Received from [facility]  Stress: No Stress Concern Present (02/14/2024)   Received from [facility]  Tobacco Use: Low Risk  (08/27/2024)  Health Literacy: Inadequate Health Literacy (04/11/2024)   Received from [facility]   SDOH Interventions:     Readmission Risk Interventions    08/28/2024   11:08 AM  Readmission Risk Prevention Plan  Post Dischage Appt Complete  Medication Screening Complete  Transportation Screening Complete

## 2024-08-28 NOTE — Plan of Care (Signed)

## 2024-08-28 NOTE — Evaluation (Addendum)
 Physical Therapy Evaluation Patient Details Name: James Gillespie MRN: [id number] DOB: November 10, 1950 Today's Date: 08/28/2024  History of Present Illness  James Gillespie  is a 73 y.o. 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  - Patient presents to ED per podiatry instructions Dr. [name], patient with known right foot infection, which has been worsening, neuropathic, does not feel pain, he has been having large amount of drainage from his foot, had prior debridement in the office with packing in place, he was instructed to come to ED by his podiatrist as a need of amputation, refusing until this point, had debridement in the office, and he finished Augmentin  and doxycycline without much improvement, x-ray in the office showing osteomyelitis, so patient was directed to come to ED.  - In ED Significant for hypokalemia 3.2, lactic acid of 2, cultures were sent, patient was started on IV Unasyn  and linezolid  and [facility] hospitalist consulted to admit with anticipation of first ray amputation on Friday.     Clinical Impression  Pt. Presented w/ general weakness in LE and RLE pain due to dx. Pt. Tolerated session well, there was co-treatment w/ OT. Pt was able to perform bed mobility w/ Mod A to EOB, pt was fatigued during bed mobility, pt had short period of rest before transfer. Initially during transfer from sit to stand pt was using quad cane, then changed to RW. Pt was able to perform transfer from bed to chair w/ RW and Mod A. During ambulation pt used RW and Min A and cuing for using rearfoot-midfoot during ambulation, due planned R. Great toe amputation on (08/29/2024). Pt was left in bed w/ call bell and nursing staff was notified on pt. Status. Patient will benefit from continued skilled physical therapy in hospital and recommended venue below to increase strength, balance, endurance for safe ADLs and gait.       If plan is  discharge home, recommend the following: A little help with walking and/or transfers;Assist for transportation;Assistance with cooking/housework;A little help with bathing/dressing/bathroom;Help with stairs or ramp for entrance   Can travel by private vehicle   Yes    Equipment Recommendations None recommended by PT  Recommendations for Other Services       Functional Status Assessment Patient has had a recent decline in their functional status and demonstrates the ability to make significant improvements in function in a reasonable and predictable amount of time.     Precautions / Restrictions Precautions Precautions: Fall Recall of Precautions/Restrictions: Intact Precaution/Restrictions Comments: R great toe wound. Restrictions Weight Bearing Restrictions Per Provider Order: No      Mobility  Bed Mobility Overal bed mobility: Needs Assistance Bed Mobility: Supine to Sit     Supine to sit: Mod assist     General bed mobility comments: labored and unsteady; assist for B LE mobility and some for trunk control as well.    Transfers Overall transfer level: Needs assistance Equipment used: Rolling walker (2 wheels), Quad cane Transfers: Sit to/from Stand, Bed to chair/wheelchair/BSC Sit to Stand: Min assist, Mod assist   Step pivot transfers: Min assist, Mod assist       General transfer comment: Stit to stand with quad cane but pt was unsteady seeking to hold onto PT student. Pt sat and the RW was used with min to mod A for EOB to chair transfer.    Ambulation/Gait Ambulation/Gait assistance: Min assist Gait Distance (Feet): 35 Feet Assistive device:  Rolling walker (2 wheels) Gait Pattern/deviations: Step-to pattern, Decreased step length - right, Decreased step length - left, Decreased stance time - right, Decreased stance time - left, Decreased weight shift to right       General Gait Details: Pt. was able to ambulate w/ RW, pt. was instructed to put more  weight through the rearfoot-mindfoot during ambulation w/ RW  Stairs            Wheelchair Mobility     Tilt Bed    Modified Rankin (Stroke Patients Only)       Balance Overall balance assessment: Needs assistance Sitting-balance support: No upper extremity supported, Feet supported Sitting balance-Leahy Scale: Fair Sitting balance - Comments: fair to good seated at EOB   Standing balance support: Single extremity supported, During functional activity Standing balance-Leahy Scale: Poor Standing balance comment: poor with quad cane; poor to fair with RW.                             Pertinent Vitals/Pain Pain Assessment Pain Assessment: No/denies pain Pain Score: 9  Pain Location: R LE during movement. Pain Descriptors / Indicators: Sharp Pain Intervention(s): Limited activity within patient's tolerance, Monitored during session, Repositioned    Home Living Family/patient expects to be discharged to:: Private residence Living Arrangements: Alone Available Help at Discharge: Family;Available PRN/intermittently Type of Home: House Home Access: Stairs to enter Entrance Stairs-Rails: None Entrance Stairs-Number of Steps: 1   Home Layout: Two level;Able to live on main level with bedroom/bathroom Home Equipment: Rolling Walker (2 wheels);Cane - quad      Prior Function Prior Level of Function : Needs assist       Physical Assist : ADLs (physical)   ADLs (physical): IADLs Mobility Comments: Household ambulator with RW and quad cane used in community. ADLs Comments: Independent ADL's; assisted IADL's by sister.     Extremity/Trunk Assessment   Upper Extremity Assessment Upper Extremity Assessment: Defer to OT evaluation    Lower Extremity Assessment Lower Extremity Assessment: Generalized weakness    Cervical / Trunk Assessment Cervical / Trunk Assessment: Normal  Communication   Communication Communication: No apparent difficulties     Cognition Arousal: Alert Behavior During Therapy: WFL for tasks assessed/performed   PT - Cognitive impairments: No apparent impairments                         Following commands: Intact       Cueing Cueing Techniques: Verbal cues, Tactile cues     General Comments      Exercises     Assessment/Plan    PT Assessment Patient needs continued PT services  PT Problem List Decreased strength;Decreased range of motion;Decreased activity tolerance;Decreased safety awareness;Decreased balance;Decreased mobility;Decreased coordination       PT Treatment Interventions DME instruction;Gait training;Stair training;Functional mobility training;Therapeutic activities;Therapeutic exercise;Balance training;Patient/family education    PT Goals (Current goals can be found in the Care Plan section)  Acute Rehab PT Goals Patient Stated Goal: pt. wants to go rehab to regain strenght. PT Goal Formulation: With patient Time For Goal Achievement: 09/04/24 Potential to Achieve Goals: Good    Frequency Min 3X/week     Co-evaluation PT/OT/SLP Co-Evaluation/Treatment: Yes Reason for Co-Treatment: To address functional/ADL transfers PT goals addressed during session: Mobility/safety with mobility OT goals addressed during session: ADL's and self-care       AM-PAC PT 6 Clicks Mobility  Outcome Measure Help needed  turning from your back to your side while in a flat bed without using bedrails?: A Little Help needed moving from lying on your back to sitting on the side of a flat bed without using bedrails?: A Little Help needed moving to and from a bed to a chair (including a wheelchair)?: A Lot Help needed standing up from a chair using your arms (e.g., wheelchair or bedside chair)?: A Lot Help needed to walk in hospital room?: A Lot Help needed climbing 3-5 steps with a railing? : A Lot 6 Click Score: 14    End of Session Equipment Utilized During Treatment: Gait  belt Activity Tolerance: Patient tolerated treatment well;Patient limited by fatigue Patient left: in bed;with call bell/phone within reach Nurse Communication: Mobility status PT Visit Diagnosis: History of falling (Z91.81);Muscle weakness (generalized) (M62.81);Other abnormalities of gait and mobility (R26.89)    Time: 9085-9065 PT Time Calculation (min) (ACUTE ONLY): 20 min   Charges:   PT Evaluation $PT Eval Moderate Complexity: 1 Mod PT Treatments $Therapeutic Activity: 8-22 mins PT General Charges $$ ACUTE PT VISIT: 1 Visit       Leshon Armistead, SPT

## 2024-08-28 NOTE — Evaluation (Signed)
 Occupational Therapy Evaluation Patient Details Name: James Gillespie MRN: [id number] DOB: 07/22/1951 Today's Date: 08/28/2024   History of Present Illness   James Gillespie  is a 73 y.o. 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  - Patient presents to ED per podiatry instructions Dr. [name], patient with known right foot infection, which has been worsening, neuropathic, does not feel pain, he has been having large amount of drainage from his foot, had prior debridement in the office with packing in place, he was instructed to come to ED by his podiatrist as a need of amputation, refusing until this point, had debridement in the office, and he finished Augmentin and doxycycline without much improvement, x-ray in the office showing osteomyelitis, so patient was directed to come to ED.  - In ED Significant for hypokalemia 3.2, lactic acid of 2, cultures were sent, patient was started on IV Unasyn and linezolid and [facility] hospitalist consulted to admit with anticipation of first ray amputation on Friday. (per MD)     Clinical Impressions Pt agreeable to OT and PT co-evaluation. Pt lives alone at baseline with PRN support from sister for IADL's. Pt required mod A for bed mobility and min to mod A for EOB to chair transfer with RW. Pt reports not using socks at baseline. Max A needed for donning socks today, indicating some assist needed for lower body dressing at this time. B UE generally weak. Pt unsteady with quad cane and needing use of RW for standing tasks. Pt left in the bed with call bell within reach. Pt will benefit from continued OT in the hospital to increase strength, balance, and endurance for safe ADL's.        If plan is discharge home, recommend the following:   A lot of help with walking and/or transfers;A lot of help with bathing/dressing/bathroom;Assistance with cooking/housework;Assist for  transportation;Help with stairs or ramp for entrance     Functional Status Assessment   Patient has had a recent decline in their functional status and demonstrates the ability to make significant improvements in function in a reasonable and predictable amount of time.     Equipment Recommendations   None recommended by OT      Precautions/Restrictions   Precautions Precautions: Fall Recall of Precautions/Restrictions: Intact Precaution/Restrictions Comments: R great toe wound. Restrictions Weight Bearing Restrictions Per Provider Order: No     Mobility Bed Mobility Overal bed mobility: Needs Assistance Bed Mobility: Supine to Sit     Supine to sit: Mod assist     General bed mobility comments: labored and unsteady; assist for B LE mobility and some for trunk control as well.    Transfers Overall transfer level: Needs assistance Equipment used: Rolling walker (2 wheels), Quad cane Transfers: Sit to/from Stand, Bed to chair/wheelchair/BSC Sit to Stand: Min assist, Mod assist     Step pivot transfers: Min assist, Mod assist     General transfer comment: Stit to stand with quad cane but pt was unsteady seeking to hold onto PT student. Pt sat and the RW was used with min to mod A for EOB to chair transfer.      Balance Overall balance assessment: Needs assistance Sitting-balance support: No upper extremity supported, Feet supported Sitting balance-Leahy Scale: Fair Sitting balance - Comments: fair to good seated at EOB   Standing balance support: Single extremity supported, During functional activity Standing balance-Leahy Scale: Poor Standing balance comment: poor with quad  cane; poor to fair with RW.                           ADL either performed or assessed with clinical judgement   ADL Overall ADL's : Needs assistance/impaired     Grooming: Set up;Sitting   Upper Body Bathing: Set up;Sitting   Lower Body Bathing: Minimal  assistance;Moderate assistance;Sitting/lateral leans Lower Body Bathing Details (indicate cue type and reason): based on pt's inability to don socks at EOB without assist. Upper Body Dressing : Set up;Sitting   Lower Body Dressing: Maximal assistance;Sitting/lateral leans Lower Body Dressing Details (indicate cue type and reason): Required assist to son socks at EOB; reports he does not wear socks at baseline but only shoes. Toilet Transfer: Moderate assistance;Minimal assistance;Rolling walker (2 wheels);Squat-pivot Statistician Details (indicate cue type and reason): EOB to chair with RW Toileting- Clothing Manipulation and Hygiene: Minimal assistance;Sitting/lateral lean       Functional mobility during ADLs: Minimal assistance;Contact guard assist;Rolling walker (2 wheels) General ADL Comments: Able to ambualte in the hall with RW.     Vision Baseline Vision/History: 0 No visual deficits (Hx of cataracts removal.) Ability to See in Adequate Light: 0 Adequate Patient Visual Report: No change from baseline Vision Assessment?: No apparent visual deficits     Perception Perception: Not tested       Praxis Praxis: Not tested       Pertinent Vitals/Pain Pain Assessment Pain Assessment: 0-10 Pain Score: 9  Pain Location: R LE during movement. Pain Descriptors / Indicators: Sharp Pain Intervention(s): Monitored during session, Repositioned, Limited activity within patient's tolerance     Extremity/Trunk Assessment Upper Extremity Assessment Upper Extremity Assessment: Generalized weakness   Lower Extremity Assessment Lower Extremity Assessment: Defer to PT evaluation   Cervical / Trunk Assessment Cervical / Trunk Assessment: Normal   Communication Communication Communication: No apparent difficulties   Cognition Arousal: Alert Behavior During Therapy: WFL for tasks assessed/performed Cognition: No apparent impairments                                Following commands: Intact       Cueing  General Comments   Cueing Techniques: Verbal cues;Tactile cues                 Home Living Family/patient expects to be discharged to:: Private residence Living Arrangements: Alone Available Help at Discharge: Family;Available PRN/intermittently (sister) Type of Home: House Home Access: Stairs to enter [employer] of Steps: 1 Entrance Stairs-Rails: None Home Layout: Two level;Able to live on main level with bedroom/bathroom     Bathroom Shower/Tub: Sponge bathes at baseline   Bathroom Toilet: Handicapped height Bathroom Accessibility: No   Home Equipment: Agricultural Consultant (2 wheels);Cane - quad          Prior Functioning/Environment Prior Level of Function : Needs assist       Physical Assist : ADLs (physical)   ADLs (physical): IADLs Mobility Comments: Household ambulator with RW and quad cane used in community. ADLs Comments: Independent ADL's; assisted IADL's by sister.    OT Problem List: Decreased strength;Decreased activity tolerance;Impaired balance (sitting and/or standing);Pain   OT Treatment/Interventions: Self-care/ADL training;Therapeutic exercise;Therapeutic activities;Balance training;Patient/family education;DME and/or AE instruction      OT Goals(Current goals can be found in the care plan section)   Acute Rehab OT Goals Patient Stated Goal: Improve function. OT Goal Formulation: With  patient Time For Goal Achievement: 09/11/24 Potential to Achieve Goals: Good   OT Frequency:  Min 2X/week    Co-evaluation PT/OT/SLP Co-Evaluation/Treatment: Yes Reason for Co-Treatment: To address functional/ADL transfers   OT goals addressed during session: ADL's and self-care                       End of Session Equipment Utilized During Treatment: Rolling walker (2 wheels);Gait belt  Activity Tolerance: Patient tolerated treatment well Patient left: in bed;with call bell/phone  within reach  OT Visit Diagnosis: Unsteadiness on feet (R26.81);Muscle weakness (generalized) (M62.81);History of falling (Z91.81);Pain Pain - Right/Left: Right Pain - part of body: Leg                Time: 9085-9065 OT Time Calculation (min): 20 min Charges:  OT General Charges $OT Visit: 1 Visit OT Evaluation $OT Eval Low Complexity: 1 Low  Charles Andringa OT, MOT  Jayson Person 08/28/2024, 11:01 AM

## 2024-08-28 NOTE — Progress Notes (Signed)
 [facility]  PROCEDURAL EXPEDITER PROGRESS NOTE  Patient Name: Shreyan Hinz  DOB:1951-08-12 Date of Admission: 08/27/2024  Date of Assessment:08/28/24   -------------------------------------------------------------------------------------------------------------------   Brief clinical summary: Pt having amputation of part of right foot.  Orders in place:  No   Communication with surgical team if no orders: sent an IB to Dr. [name] for consent orders  Labs, test, and orders reviewed: yes  Requires surgical clearance:  No  What type of clearance: n/a  Clearance received: n/a  Barriers noted:no orders for consent , device information requested by Luke Milroy RN at [facility] PAT  Intervention provided by [facility] team: Luke Milroy reached out to the device company to see if anything needs to be done she I waiting to hear back from them.   Barrier resolved:  no   -------------------------------------------------------------------------------------------------------------------  [employer], Ronal DELENA Bald Please contact us  directly via secure chat (search for [facility]) or by calling us  at [phone] [facility]).

## 2024-08-28 NOTE — Plan of Care (Signed)
  Problem: Acute Rehab OT Goals (only OT should resolve) Goal: Pt. Will Perform Grooming Flowsheets (Taken 08/28/2024 1103) Pt Will Perform Grooming:  with modified independence  standing Goal: Pt. Will Perform Lower Body Bathing Flowsheets (Taken 08/28/2024 1103) Pt Will Perform Lower Body Bathing: with modified independence Goal: Pt. Will Perform Lower Body Dressing Flowsheets (Taken 08/28/2024 1103) Pt Will Perform Lower Body Dressing: with modified independence Goal: Pt. Will Transfer To Toilet Flowsheets (Taken 08/28/2024 1103) Pt Will Transfer to Toilet:  with modified independence  ambulating Goal: Pt. Will Perform Toileting-Clothing Manipulation Flowsheets (Taken 08/28/2024 1103) Pt Will Perform Toileting - Clothing Manipulation and hygiene:  with modified independence  sitting/lateral leans  sit to/from stand Goal: Pt/Caregiver Will Perform Home Exercise Program Flowsheets (Taken 08/28/2024 1103) Pt/caregiver will Perform Home Exercise Program:  Increased strength  Both right and left upper extremity  Independently  Yvette Loveless OT, MOT

## 2024-08-28 NOTE — Plan of Care (Signed)
  Problem: Acute Rehab PT Goals(only PT should resolve) Goal: Pt Will Go Supine/Side To Sit Outcome: Progressing Flowsheets (Taken 08/28/2024 1205) Pt will go Supine/Side to Sit:  with minimal assist  with contact guard assist Goal: Pt Will Go Sit To Supine/Side Outcome: Progressing Flowsheets (Taken 08/28/2024 1205) Pt will go Sit to Supine/Side:  with contact guard assist  with minimal assist Goal: Patient Will Perform Sitting Balance Outcome: Progressing Flowsheets (Taken 08/28/2024 1205) Patient will perform sitting balance:  with contact guard assist  with minimal assist Goal: Patient Will Transfer Sit To/From Stand Outcome: Progressing Flowsheets (Taken 08/28/2024 1205) Patient will transfer sit to/from stand:  with contact guard assist  with minimal assist Goal: Pt Will Transfer Bed To Chair/Chair To Bed Outcome: Progressing Flowsheets (Taken 08/28/2024 1205) Pt will Transfer Bed to Chair/Chair to Bed:  with contact guard assist  with min assist Goal: Pt Will Perform Standing Balance Or Pre-Gait Outcome: Progressing Flowsheets (Taken 08/28/2024 1205) Pt will perform standing balance or pre-gait:  with contact guard assist  with minimal assist Goal: Pt Will Ambulate Outcome: Progressing Flowsheets (Taken 08/28/2024 1205) Pt will Ambulate:  50 feet  with contact guard assist  with minimal assist  with rolling walker  with cues (comment type and amount) WB w/ Rearfoot -midfoot   Ivery Cable, SPT

## 2024-08-28 NOTE — Progress Notes (Signed)
 Initial Nutrition Assessment  DOCUMENTATION CODES:   Not applicable  INTERVENTION:   Juven 1 packet PO BID, each packet provides 80 calories, 8 grams of carbohydrate, 2.5  grams of protein (collagen), 7 grams of L-arginine and 7 grams of L-glutamine; supplement contains CaHMB, Vitamins C, E, B12 and Zinc  to promote wound healing.  Ensure Plus High Protein po BID, each supplement provides 350 kcal and 20 grams of protein.  Continue MVI with minerals daily.  Education provided on increasing protein intake.  NUTRITION DIAGNOSIS:   Increased nutrient needs related to wound healing as evidenced by estimated needs.  GOAL:   Patient will meet greater than or equal to 90% of their needs  MONITOR:   PO intake, Supplement acceptance, Skin  REASON FOR ASSESSMENT:   Consult Wound healing  ASSESSMENT:   73 yo male admitted with R foot osteomyelitis. PMH includes HTN, pacemaker, ETOH abuse, HLD, neuropathy, CVA, PAD, prediabetes.  Patient reports good intake of breakfast today. He typically eats cereal and milk for breakfast, and pot pie for lunch and dinner. His sister does his grocery shopping for him. He drinks whiskey and beer. He is unable to stand at the stove and cook. He goes out to eat on occasion. From review of usual intake, protein intake is inadequate to support healing. We discussed good sources of dietary protein and ways to increase protein intake. He agreed to try some supplements, including Ensure and Juven.    Plans for partial first ray amputation with potential resection of second metatarsal head and VAC placement tomorrow morning by Podiatrist.   Currently on a heart healthy diet. Meal intakes: 100% of breakfast today  Labs reviewed.  Prealbumin 11  Prealbumin is an acute phase protein and serum value is low d/t acute inflammatory process. A1C 5.8  Medications reviewed and include folic acid , MVI with minerals, flomax , thiamine , IV antibiotics.  NUTRITION -  FOCUSED PHYSICAL EXAM:  Flowsheet Row Most Recent Value  Orbital Region No depletion  Upper Arm Region No depletion  Thoracic and Lumbar Region No depletion  Buccal Region Mild depletion  Temple Region Mild depletion  Clavicle Bone Region Mild depletion  Clavicle and Acromion Bone Region No depletion  Scapular Bone Region No depletion  Dorsal Hand No depletion  Patellar Region Unable to assess  Anterior Thigh Region Unable to assess  Posterior Calf Region Mild depletion  Edema (RD Assessment) Mild  Hair Reviewed  Eyes Reviewed  Mouth Reviewed  Skin Reviewed  Nails Reviewed    Diet Order:   Diet Order             Diet NPO time specified Except for: Sips with Meds  Diet effective midnight           Diet Heart Room service appropriate? Yes; Fluid consistency: Thin  Diet effective now                   EDUCATION NEEDS:   Education needs have been addressed  Skin:  Skin Assessment: Skin Integrity Issues: Skin Integrity Issues:: Other (Comment) Other: acute osteomyelitis of right foot  Last BM:  11/19  Height:   Ht Readings from Last 1 Encounters:  08/27/24 5' 11 (1.803 m)    Weight:   Wt Readings from Last 1 Encounters:  08/27/24 78.9 kg    Ideal Body Weight:  78.2 kg  BMI:  Body mass index is 24.27 kg/m.  Estimated Nutritional Needs:   Kcal:  2100-2300  Protein:  120-130 gm  Fluid:  2.1-2.3 L   Suzen HUNT RD, LDN, CNSC Contact via secure chat. If unavailable, use group chat RD Inpatient.

## 2024-08-28 NOTE — H&P (View-Only) (Signed)
 PODIATRY CONSULT NOTE  REASON FOR CONSULTATION Osteomyelitis of the right foot.  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 He presented for routine follow-up on 08/05/2024 with cellulitis of his right foot and associated abscess. At that time, hospitalization was recommended, but the patient declined. Debridement and drainage of the abscess was was performed, and a culture was obtained. He was empirically placed on doxycycline. An MRI was originally scheduled but could not be performed due to the presence of a pacemaker and incompatibility with our institution's MRI. A CT was performed instead. Culture results revealed heavy growth of Staphylococcus aureus (negative for inducible clindamycin  resistance) and heavy growth of Proteus penneri. S. aureus was sensitive to tetracyclines, and P. penneri was sensitive to Augmentin. The patient was subsequently placed on Augmentin. Radiographs performed at his last visit on 08/26/2024 showed subtle changes involving the proximal phalanx and head of the first metatarsal consistent with osteomyelitis. Admission was recommended, but he declined that evening citing an appointment the following day. He was instructed to go to the emergency room at [facility] for evaluation and admission as he needs at least a first ray amputation of the right foot. He presented to the emergency room on 08/27/2024. He denies any current nausea, vomiting, fevers, or chills.  I have had multiple lengthy conversations with his son.  He has informed me that James Gillespie drinks approximately half a gallon of whiskey and about 10 beers per week.   Past Medical History:  Diagnosis Date   Arthritis    Chronic kidney disease    History of gout    History of kidney stones    Hypertension    Neuropathy    Pre-diabetes     Stroke (HCC) 2017   No deficits   Scheduled Meds:  amLODipine  10 mg Oral Daily   atorvastatin  40 mg Oral QHS   enoxaparin (LOVENOX) injection  1 mg/kg Subcutaneous Q12H   escitalopram  5 mg Oral Daily   folic acid  1 mg Oral Daily   gabapentin  200 mg Oral BID   hydrALAZINE  25 mg Oral BID   linezolid  600 mg Oral Q12H   losartan  50 mg Oral Daily   multivitamin with minerals  1 tablet Oral Daily   tamsulosin  0.4 mg Oral QPC supper   thiamine  100 mg Oral Daily   Or   thiamine  100 mg Intravenous Daily   Continuous Infusions:  ampicillin-sulbactam (UNASYN) IV 3 g (08/28/24 0552)   PRN Meds:.acetaminophen  **OR** acetaminophen , albuterol, hydrALAZINE, LORazepam **OR** LORazepam, morphine injection, ondansetron  **OR** ondansetron  (ZOFRAN ) IV, oxyCODONE  No Known Allergies Past Surgical History:  Procedure Laterality Date   AMPUTATION Right 02/12/2020   Procedure: PARTIAL AMPUTATION DIGIT SECOND TOE RIGHT FOOT;  Surgeon: [name] Katz, DPM;  Location: [facility];  Service: Podiatry;  Laterality: Right;   BACK SURGERY     lumbar disc   CARPAL TUNNEL RELEASE Left    HEMORRHOID SURGERY     HERNIA REPAIR Left    Inguinal   INCISION AND DRAINAGE OF WOUND Right 12/03/2019   Procedure: DEBRIDEMENT ULCERATION 3rd METATARSAL RIGHT FOOT;  Surgeon: [name] Katz, DPM;  Location: [facility];  Service: Podiatry;  Laterality: Right;   LITHOTRIPSY     METATARSAL HEAD EXCISION Left 01/16/2018   Procedure: 5TH METATARSAL HEAD  RESECTION;  Surgeon: [name] Katz, DPM;  Location: [facility];  Service: Podiatry;  Laterality: Left;   OSTECTOMY Right 05/12/2020   Procedure: 5TH METATARSAL HEAD RESECTION;  Surgeon: [name] Katz, DPM;  Location: [facility];  Service: Podiatry;  Laterality: Right;   WEIL OSTEOTOMY Right 11/21/2017   Procedure: WEIL OSTEOTOMY RIGHT 4TH METATARSAL;  Surgeon: [name] Katz, DPM;  Location: [facility];  Service: Podiatry;  Laterality: Right;   WEIL OSTEOTOMY Right  12/03/2019   Procedure: WEIL OSTEOTOMY 3rd METATARSAL FOOT;  Surgeon: [name] Katz, DPM;  Location: [facility];  Service: Podiatry;  Laterality: Right;   WOUND DEBRIDEMENT Right 11/21/2017   Procedure: DEBRIDEMENT ULCERATION RIGHT FOOT;  Surgeon: [name] Katz, DPM;  Location: [facility];  Service: Podiatry;  Laterality: Right;   WOUND DEBRIDEMENT Left 01/16/2018   Procedure: DEBRIDEMENT ULCERATION LEFT FOOT;  Surgeon: [name] Katz, DPM;  Location: [facility];  Service: Podiatry;  Laterality: Left;   WOUND DEBRIDEMENT Right 05/12/2020   Procedure: DEBRIDEMENT ULCERATION;  Surgeon: [name] Katz, DPM;  Location: [facility];  Service: Podiatry;  Laterality: Right;   History reviewed. No pertinent family history.  SOCIAL HISTORY I have had multiple lengthy conversations with his son.  He has informed me that James Gillespie drinks approximately half a gallon of whiskey and about 10 beers per week.  He also stated that his dad smokes large quantities of marijuana.  REVIEW OF SYSTEMS Integument: Chronic ulceration of the right foot. Neurologic: Peripheral neuropathy. Poor balance.  PHYSICAL EXAMINATION Vital signs in last 24 hours:   Temp:  [97.2 F (36.2 C)-97.7 F (36.5 C)] 97.4 F (36.3 C) (11/20 0428) Pulse Rate:  [60-67] 60 (11/20 0428) Resp:  [16-18] 18 (11/20 0428) BP: (111-182)/(74-98) 169/98 (11/20 0428) SpO2:  [92 %-99 %] 99 % (11/20 0428) Weight:  [78.9 kg] 78.9 kg (11/19 1401)  Dressing: Packing in place.  Integument: Ulceration of R foot unchanged from 08/26/2024.  Vascular: R DP pulse palpable. R PT pulse palpable. Edema of R foot. Musculoskeletal: Calves soft, NT.  LAB/TEST RESULTS   Recent Labs    08/27/24 1652 08/28/24 0424  WBC 10.0 6.9  HGB 17.8* 16.0  HCT 53.4* 48.1  PLT 202 202  NA 140 142  K 3.2* 4.1  CL 101 105  CO2 25 27  BUN 18 16  CREATININE 1.18 1.16  GLUCOSE 100* 84  CALCIUM  9.3 8.9    Recent Results (from the past 240 hours)  Blood Cultures  x 2 sites     Status: None (Preliminary result)   Collection Time: 08/27/24  5:30 PM   Specimen: BLOOD  Result Value Ref Range Status   Specimen Description BLOOD BLOOD RIGHT ARM  Final   Special Requests NONE  Final   Culture   Final    NO GROWTH < 12 HOURS Performed at [facility], [address] [place], [place] [address]    Report Status PENDING  Incomplete  Blood Cultures x 2 sites     Status: None (Preliminary result)   Collection Time: 08/27/24  5:37 PM   Specimen: BLOOD  Result Value Ref Range Status   Specimen Description BLOOD BLOOD LEFT ARM  Final   Special Requests NONE  Final   Culture   Final    NO GROWTH < 12 HOURS Performed at [facility], [address] [place], [place] [address]    Report Status PENDING  Incomplete     DG Knee Complete 4 Views Right Result Date: 08/27/2024 CLINICAL DATA:  Infection  EXAM: RIGHT KNEE - COMPLETE 4+ VIEW COMPARISON:  None Available. FINDINGS: No evidence of fracture, dislocation, or joint effusion. 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 Soft tissues are unremarkable. IMPRESSION: 1. No acute fracture or dislocation. 2. Medial and lateral compartment chondrocalcinosis. Electronically Signed   By: Greig Pique M.D.   On: 08/27/2024 18:17   DG Foot 2 Views Right Result Date: 08/27/2024 EXAM: 1 or 2 VIEW(S) XRAY OF THE FOOT 08/27/2024 05:34:53 PM COMPARISON: None available. CLINICAL HISTORY: infection FINDINGS: BONES AND JOINTS: Screw within third Metatarsal head noted. Second Digit amputation at level of MTP joint. Chronic deformity of third through fifth Digits. Query erosion along plantar aspect of first Digit Proximal Phalanx base with concern for osteomyelitis. Degenerative changes of first Digit Metatarsophalangeal joint. SOFT TISSUES: Subcutaneous soft tissue edema of forefoot. Vascular calcifications. Ulceration over the ball of the foot. IMPRESSION: 1. Ulceration over the ball of the foot with concern for  osteomyelitis at the base of the proximal phalanx of the first digit. 2. Second digit amputation at the level of the MTP joint. 3. Screw within the third metatarsal head. Electronically signed by: Norleen Boxer MD 08/27/2024 06:11 PM EST RP Workstation: [id number]    ASSESSMENT & PLAN # Right foot osteomyelitis Recommend at least a partial first ray amputation, with possible resection of the second metatarsal head and possible Wound VAC placement. Surgery scheduled for tomorrow morning. Patient to remain NPO after midnight. Hold Lovenox  this evening. Patient understands and agrees with the course of care. Patient's son, Penne, has been notify of the plan.  # Discharge planning Patient would benefit from skilled nursing placement following discharge due to fall risk and need for ongoing local wound care.  Morene LILLETTE Anon 08/28/2024, 9:47 AM

## 2024-08-28 NOTE — Progress Notes (Signed)
 Consent for Partial 1st ray amputation of the right foot (amputation of the right great toe and a portion of the right first metatarsal). Possible resection of the second metatarsal head. Possible placement of a Wound VAC. Signed and placed in Pt paper chart.

## 2024-08-28 NOTE — TOC PASRR Note (Signed)
 [facility] IP TOC PASRR NOTE  30 Day PASRR Note   Patient Details  Name: Kwaku Mostafa Date of Birth: [birth year]   Transition of Care [facility]) CM/SW Contact:    [name], LCSW Phone Number: 08/28/2024, 2:48 PM  To Whom It May Concern:  Please be advised that this patient will require a short-term nursing home stay - anticipated 30 days or less for rehabilitation and strengthening.   The plan is for return home.

## 2024-08-28 NOTE — NC FL2 (Signed)
 [place]  MEDICAID [id number] LEVEL OF CARE FORM     IDENTIFICATION  Patient Name: James Gillespie Birthdate: Dec 09, 1950 Sex: male Admission Date (Current Location): 08/27/2024  [facility] and [place] Number:  [employer] and Address:  [facility],  [address] [place] [address]      Provider Number: [id number]  Attending Physician Name and Address:  Juvenal Harlene PENNER, DO  Relative Name and Phone Number:  Kip Factor (Sister)  [phone]    Current Level of Care: Hospital Recommended Level of Care: Skilled Nursing Facility Prior Approval Number:    Date Approved/Denied:   PASRR Number: Pending  Discharge Plan: SNF    Current Diagnoses: Patient Active Problem List   Diagnosis Date Noted   Acute osteomyelitis of right foot (HCC) 08/27/2024   History of CVA (cerebrovascular accident) 08/27/2024   PAD (peripheral artery disease) 08/27/2024   Hyperlipidemia 08/27/2024   Essential hypertension 08/27/2024    Orientation RESPIRATION BLADDER Height & Weight     Self, Time, Situation, Place  Normal Continent Weight: 174 lb (78.9 kg) Height:  5' 11 (180.3 cm)  BEHAVIORAL SYMPTOMS/MOOD NEUROLOGICAL BOWEL NUTRITION STATUS      Continent Diet (See DC summary)  AMBULATORY STATUS COMMUNICATION OF NEEDS Skin   Limited Assist Verbally Normal                       Personal Care Assistance Level of Assistance  Bathing, Feeding, Dressing Bathing Assistance: Limited assistance Feeding assistance: Independent Dressing Assistance: Limited assistance     Functional Limitations Info  Sight, Hearing, Speech Sight Info: Adequate Hearing Info: Adequate Speech Info: Adequate    SPECIAL CARE FACTORS FREQUENCY  PT (By licensed PT), OT (By licensed OT)     PT Frequency: 5x/wk OT Frequency: 5x/wk            Contractures Contractures Info: Not present    Additional Factors Info  Code Status, Allergies Code Status Info: FULL Allergies Info: No Known  Allergies           Current Medications (08/28/2024):  This is the current hospital active medication list Current Facility-Administered Medications  Medication Dose Route Frequency Provider Last Rate Last Admin   acetaminophen  (TYLENOL ) tablet 650 mg  650 mg Oral Q6H PRN Elgergawy, Dawood S, MD       Or   acetaminophen  (TYLENOL ) suppository 650 mg  650 mg Rectal Q6H PRN Elgergawy, Dawood S, MD       albuterol (PROVENTIL) (2.5 MG/3ML) 0.083% nebulizer solution 2.5 mg  2.5 mg Nebulization Q2H PRN Elgergawy, Dawood S, MD       amLODipine (NORVASC) tablet 10 mg  10 mg Oral Daily Elgergawy, Dawood S, MD   10 mg at 08/28/24 0834   Ampicillin-Sulbactam (UNASYN) 3 g in sodium chloride  0.9 % 100 mL IVPB  3 g Intravenous Q6H Merrill, Kristin A, RPH 200 mL/hr at 08/28/24 1113 3 g at 08/28/24 1113   atorvastatin (LIPITOR) tablet 40 mg  40 mg Oral QHS Elgergawy, Dawood S, MD   40 mg at 08/27/24 2129   escitalopram (LEXAPRO) tablet 5 mg  5 mg Oral Daily Elgergawy, Dawood S, MD   5 mg at 08/28/24 0834   folic acid (FOLVITE) tablet 1 mg  1 mg Oral Daily Elgergawy, Dawood S, MD   1 mg at 08/28/24 0835   gabapentin (NEURONTIN) capsule 200 mg  200 mg Oral BID Elgergawy, Dawood S, MD   200 mg at 08/28/24 [phone]  hydrALAZINE (APRESOLINE) injection 5 mg  5 mg Intravenous Q4H PRN Elgergawy, Dawood S, MD       hydrALAZINE (APRESOLINE) tablet 25 mg  25 mg Oral BID Elgergawy, Dawood S, MD   25 mg at 08/28/24 0835   lidocaine  (LIDODERM ) 5 % 1 patch  1 patch Transdermal Q24H Vann, Jessica U, DO   1 patch at 08/28/24 1116   linezolid (ZYVOX) tablet 600 mg  600 mg Oral Q12H Elgergawy, Dawood S, MD   600 mg at 08/28/24 0834   LORazepam (ATIVAN) tablet 1-4 mg  1-4 mg Oral Q1H PRN Elgergawy, Dawood S, MD       Or   LORazepam (ATIVAN) injection 1-4 mg  1-4 mg Intravenous Q1H PRN Elgergawy, Dawood S, MD       losartan (COZAAR) tablet 50 mg  50 mg Oral Daily Elgergawy, Dawood S, MD   50 mg at 08/28/24 0835   morphine (PF) 2  MG/ML injection 2 mg  2 mg Intravenous Q4H PRN Elgergawy, Dawood S, MD       multivitamin with minerals tablet 1 tablet  1 tablet Oral Daily Elgergawy, Dawood S, MD   1 tablet at 08/28/24 9164   ondansetron  (ZOFRAN ) tablet 4 mg  4 mg Oral Q6H PRN Elgergawy, Dawood S, MD       Or   ondansetron  (ZOFRAN ) injection 4 mg  4 mg Intravenous Q6H PRN Elgergawy, Dawood S, MD       oxyCODONE (Oxy IR/ROXICODONE) immediate release tablet 5 mg  5 mg Oral Q4H PRN Elgergawy, Dawood S, MD       tamsulosin (FLOMAX) capsule 0.4 mg  0.4 mg Oral QPC supper Elgergawy, Dawood S, MD   0.4 mg at 08/27/24 2128   thiamine (VITAMIN B1) tablet 100 mg  100 mg Oral Daily Elgergawy, Dawood S, MD   100 mg at 08/28/24 9164   Or   thiamine (VITAMIN B1) injection 100 mg  100 mg Intravenous Daily Elgergawy, Dawood S, MD         Discharge Medications: Please see discharge summary for a list of discharge medications.  Relevant Imaging Results:  Relevant Lab Results:   Additional Information SSN: [id number]  [name], LCSW

## 2024-08-28 NOTE — Progress Notes (Signed)
 PROGRESS NOTE    James Gillespie  FMW:[id number] DOB: April 26, 1951 DOA: 08/27/2024 PCP: [name], MD    Brief Narrative:  James Gillespie  is a 73 y.o. 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 - Patient presents to ED per podiatry instructions Dr. [name], patient with known right foot infection, which has been worsening, neuropathic, does not feel pain, he has been having large amount of drainage from his foot, had prior debridement in the office with packing in place, he was instructed to come to ED by his podiatrist as a need of amputation, refusing until this point--- had debridement in the office, and he finished Augmentin and doxycycline without much improvement, x-ray in the office showing osteomyelitis, so patient was directed to come to ED.   Assessment and Plan: Right foot osteomyelitis - With known history of chronic right foot ulcer, sent by podiatry office given worsening wound and discharge . - X-ray significant for osteomyelitis. - Continue with IV antibiotics (IV Unasyn and p.o. linezolid), follow-up blood cultures. - Plan per podiatry for  ray amputation on Friday. -will need PT eval after surgery   Right knee pain -x ray negative -lidocaine  patch -may need brace prior to PT eval   Hyperlipidemia - Continue with atorvastatin   Neuropathic pain - Continue with gabapentin   Alcohol abuse -  CIWA protocol   Depression - Continue with Lexapro   BPH - Continue with tamsulosin   Hypertension - Continue with losartan   Hypokalemia - replaced   Permanent A-fib A-fib severe bradycardia -Status post single-chamber pacemaker 08/28/2022 - On Xarelto for anticoagulation, request per podiatry to hold in anticipation of surgery-keep on Lovenox full dose in anticipation of surgery full anticoagulation, this can be held the night before surgery   History of CAD PVD CVA - He is  supposed to be on aspirin, but not taking it so for now we will hold in anticipation of surgery, this can be resumed after surgery     Will need PT eval after surgery     DVT prophylaxis:     Code Status: Full Code Family Communication:   Disposition Plan:  Level of care: Telemetry Status is: Inpatient     Consultants:  podiatry   Subjective: Right knee pain- s/p fall  Objective: Vitals:   08/27/24 1900 08/27/24 2025 08/28/24 0059 08/28/24 0428  BP: (!) 182/84 (!) 174/93 (!) 157/93 (!) 169/98  Pulse: 62 60 60 60  Resp: 17 18 18 18   Temp:  97.7 F (36.5 C) (!) 97.4 F (36.3 C) (!) 97.4 F (36.3 C)  TempSrc:  Oral Oral Oral  SpO2: 97% 92% 98% 99%  Weight:      Height:        Intake/Output Summary (Last 24 hours) at 08/28/2024 1059 Last data filed at 08/28/2024 9041 Gross per 24 hour  Intake 862.33 ml  Output 300 ml  Net 562.33 ml   Filed Weights   08/27/24 1401  Weight: 78.9 kg    Examination:   General: Appearance:    Well developed, well nourished male in no acute distress     Lungs:     respirations unlabored  Heart:    Normal heart rate. Normal rhythm. No murmurs, rubs, or gallops.       Neurologic:   Awake, alert, oriented x 3. No apparent focal neurological           defect.  Data Reviewed: I have personally reviewed following labs and imaging studies  CBC: Recent Labs  Lab 08/27/24 1652 08/28/24 0424  WBC 10.0 6.9  NEUTROABS 6.9  --   HGB 17.8* 16.0  HCT 53.4* 48.1  MCV 96.4 97.6  PLT 202 202   Basic Metabolic Panel: Recent Labs  Lab 08/27/24 1652 08/28/24 0424  NA 140 142  K 3.2* 4.1  CL 101 105  CO2 25 27  GLUCOSE 100* 84  BUN 18 16  CREATININE 1.18 1.16  CALCIUM 9.3 8.9   GFR: Estimated Creatinine Clearance: 59.5 mL/min (by C-G formula based on SCr of 1.16 mg/dL). Liver Function Tests: No results for input(s): AST, ALT, ALKPHOS, BILITOT, PROT, ALBUMIN in the last 168 hours. No results for  input(s): LIPASE, AMYLASE in the last 168 hours. No results for input(s): AMMONIA in the last 168 hours. Coagulation Profile: No results for input(s): INR, PROTIME in the last 168 hours. Cardiac Enzymes: No results for input(s): CKTOTAL, CKMB, CKMBINDEX, TROPONINI in the last 168 hours. BNP (last 3 results) No results for input(s): PROBNP in the last 8760 hours. HbA1C: Recent Labs    08/27/24 1652  HGBA1C 5.8*   CBG: No results for input(s): GLUCAP in the last 168 hours. Lipid Profile: No results for input(s): CHOL, HDL, LDLCALC, TRIG, CHOLHDL, LDLDIRECT in the last 72 hours. Thyroid Function Tests: No results for input(s): TSH, T4TOTAL, FREET4, T3FREE, THYROIDAB in the last 72 hours. Anemia Panel: No results for input(s): VITAMINB12, FOLATE, FERRITIN, TIBC, IRON, RETICCTPCT in the last 72 hours. Sepsis Labs: Recent Labs  Lab 08/27/24 1730  LATICACIDVEN 2.0*    Recent Results (from the past 240 hours)  Blood Cultures x 2 sites     Status: None (Preliminary result)   Collection Time: 08/27/24  5:30 PM   Specimen: BLOOD  Result Value Ref Range Status   Specimen Description BLOOD BLOOD RIGHT ARM  Final   Special Requests NONE  Final   Culture   Final    NO GROWTH < 12 HOURS Performed at [facility], [address], [place], [place] [address]    Report Status PENDING  Incomplete  Blood Cultures x 2 sites     Status: None (Preliminary result)   Collection Time: 08/27/24  5:37 PM   Specimen: BLOOD  Result Value Ref Range Status   Specimen Description BLOOD BLOOD LEFT ARM  Final   Special Requests NONE  Final   Culture   Final    NO GROWTH < 12 HOURS Performed at [facility], [address] [place], [place] [address]    Report Status PENDING  Incomplete         Radiology Studies: DG Knee Complete 4 Views Right Result Date: 08/27/2024 CLINICAL DATA:  Infection EXAM: RIGHT KNEE - COMPLETE 4+ VIEW COMPARISON:   None Available. FINDINGS: No evidence of fracture, dislocation, or joint effusion. 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 Soft tissues are unremarkable. IMPRESSION: 1. No acute fracture or dislocation. 2. Medial and lateral compartment chondrocalcinosis. Electronically Signed   By: Greig Pique M.D.   On: 08/27/2024 18:17   DG Foot 2 Views Right Result Date: 08/27/2024 EXAM: 1 or 2 VIEW(S) XRAY OF THE FOOT 08/27/2024 05:34:53 PM COMPARISON: None available. CLINICAL HISTORY: infection FINDINGS: BONES AND JOINTS: Screw within third Metatarsal head noted. Second Digit amputation at level of MTP joint. Chronic deformity of third through fifth Digits. Query erosion along plantar aspect of first Digit Proximal Phalanx base with concern for  osteomyelitis. Degenerative changes of first Digit Metatarsophalangeal joint. SOFT TISSUES: Subcutaneous soft tissue edema of forefoot. Vascular calcifications. Ulceration over the ball of the foot. IMPRESSION: 1. Ulceration over the ball of the foot with concern for osteomyelitis at the base of the proximal phalanx of the first digit. 2. Second digit amputation at the level of the MTP joint. 3. Screw within the third metatarsal head. Electronically signed by: Norleen Boxer MD 08/27/2024 06:11 PM EST RP Workstation: [id number]        Scheduled Meds:  amLODipine  10 mg Oral Daily   atorvastatin  40 mg Oral QHS   escitalopram  5 mg Oral Daily   folic acid  1 mg Oral Daily   gabapentin  200 mg Oral BID   hydrALAZINE  25 mg Oral BID   lidocaine   1 patch Transdermal Q24H   linezolid  600 mg Oral Q12H   losartan  50 mg Oral Daily   multivitamin with minerals  1 tablet Oral Daily   tamsulosin  0.4 mg Oral QPC supper   thiamine  100 mg Oral Daily   Or   thiamine  100 mg Intravenous Daily   Continuous Infusions:  ampicillin-sulbactam (UNASYN) IV 3 g (08/28/24 0552)     LOS: 1 day    Time spent: 45 minutes spent on chart review, discussion  with nursing staff, consultants, updating family and interview/physical exam; more than 50% of that time was spent in counseling and/or coordination of care.    Harlene RAYMOND Bowl, DO [facility] Available via Epic secure chat 7am-7pm After these hours, please refer to coverage provider listed on [email or web address] 08/28/2024, 10:59 AM

## 2024-08-28 NOTE — Consult Note (Signed)
 PODIATRY CONSULT NOTE  REASON FOR CONSULTATION Osteomyelitis of the right foot.  HISTORY OF PRESENT ILLNESS 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 He presented for routine follow-up on 08/05/2024 with cellulitis of his right foot and associated abscess. At that time, hospitalization was recommended, but the patient declined. Debridement and drainage of the abscess was was performed, and a culture was obtained. He was empirically placed on doxycycline. An MRI was originally scheduled but could not be performed due to the presence of a pacemaker and incompatibility with our institution's MRI. A CT was performed instead. Culture results revealed heavy growth of Staphylococcus aureus (negative for inducible clindamycin  resistance) and heavy growth of Proteus penneri. S. aureus was sensitive to tetracyclines, and P. penneri was sensitive to Augmentin. The patient was subsequently placed on Augmentin. Radiographs performed at his last visit on 08/26/2024 showed subtle changes involving the proximal phalanx and head of the first metatarsal consistent with osteomyelitis. Admission was recommended, but he declined that evening citing an appointment the following day. He was instructed to go to the emergency room at [facility] for evaluation and admission as he needs at least a first ray amputation of the right foot. He presented to the emergency room on 08/27/2024. He denies any current nausea, vomiting, fevers, or chills.  I have had multiple lengthy conversations with his son.  He has informed me that [name] drinks approximately half a gallon of whiskey and about 10 beers per week.   Past Medical History:  Diagnosis Date   Arthritis    Chronic kidney disease    History of gout    History of kidney stones    Hypertension    Neuropathy    Pre-diabetes     Stroke (HCC) 2017   No deficits   Scheduled Meds:  amLODipine  10 mg Oral Daily   atorvastatin  40 mg Oral QHS   enoxaparin (LOVENOX) injection  1 mg/kg Subcutaneous Q12H   escitalopram  5 mg Oral Daily   folic acid  1 mg Oral Daily   gabapentin  200 mg Oral BID   hydrALAZINE  25 mg Oral BID   linezolid  600 mg Oral Q12H   losartan  50 mg Oral Daily   multivitamin with minerals  1 tablet Oral Daily   tamsulosin  0.4 mg Oral QPC supper   thiamine  100 mg Oral Daily   Or   thiamine  100 mg Intravenous Daily   Continuous Infusions:  ampicillin-sulbactam (UNASYN) IV 3 g (08/28/24 0552)   PRN Meds:.acetaminophen  **OR** acetaminophen , albuterol, hydrALAZINE, LORazepam **OR** LORazepam, morphine injection, ondansetron  **OR** ondansetron  (ZOFRAN ) IV, oxyCODONE  No Known Allergies Past Surgical History:  Procedure Laterality Date   AMPUTATION Right 02/12/2020   Procedure: PARTIAL AMPUTATION DIGIT SECOND TOE RIGHT FOOT;  Surgeon: [name] Katz, DPM;  Location: [facility];  Service: Podiatry;  Laterality: Right;   BACK SURGERY     lumbar disc   CARPAL TUNNEL RELEASE Left    HEMORRHOID SURGERY     HERNIA REPAIR Left    Inguinal   INCISION AND DRAINAGE OF WOUND Right 12/03/2019   Procedure: DEBRIDEMENT ULCERATION 3rd METATARSAL RIGHT FOOT;  Surgeon: [name] Katz, DPM;  Location: [facility];  Service: Podiatry;  Laterality: Right;   LITHOTRIPSY     METATARSAL HEAD EXCISION Left 01/16/2018   Procedure: 5TH METATARSAL HEAD  RESECTION;  Surgeon: [name] Katz, DPM;  Location: [facility];  Service: Podiatry;  Laterality: Left;   OSTECTOMY Right 05/12/2020   Procedure: 5TH METATARSAL HEAD RESECTION;  Surgeon: [name] Katz, DPM;  Location: [facility];  Service: Podiatry;  Laterality: Right;   WEIL OSTEOTOMY Right 11/21/2017   Procedure: WEIL OSTEOTOMY RIGHT 4TH METATARSAL;  Surgeon: [name] Katz, DPM;  Location: [facility];  Service: Podiatry;  Laterality: Right;   WEIL OSTEOTOMY Right  12/03/2019   Procedure: WEIL OSTEOTOMY 3rd METATARSAL FOOT;  Surgeon: [name] Katz, DPM;  Location: [facility];  Service: Podiatry;  Laterality: Right;   WOUND DEBRIDEMENT Right 11/21/2017   Procedure: DEBRIDEMENT ULCERATION RIGHT FOOT;  Surgeon: [name] Katz, DPM;  Location: [facility];  Service: Podiatry;  Laterality: Right;   WOUND DEBRIDEMENT Left 01/16/2018   Procedure: DEBRIDEMENT ULCERATION LEFT FOOT;  Surgeon: [name] Katz, DPM;  Location: [facility];  Service: Podiatry;  Laterality: Left;   WOUND DEBRIDEMENT Right 05/12/2020   Procedure: DEBRIDEMENT ULCERATION;  Surgeon: [name] Katz, DPM;  Location: [facility];  Service: Podiatry;  Laterality: Right;   History reviewed. No pertinent family history.  SOCIAL HISTORY I have had multiple lengthy conversations with his son.  He has informed me that [name] drinks approximately half a gallon of whiskey and about 10 beers per week.  He also stated that his dad smokes large quantities of marijuana.  REVIEW OF SYSTEMS Integument: Chronic ulceration of the right foot. Neurologic: Peripheral neuropathy. Poor balance.  PHYSICAL EXAMINATION Vital signs in last 24 hours:   Temp:  [97.2 F (36.2 C)-97.7 F (36.5 C)] 97.4 F (36.3 C) (11/20 0428) Pulse Rate:  [60-67] 60 (11/20 0428) Resp:  [16-18] 18 (11/20 0428) BP: (111-182)/(74-98) 169/98 (11/20 0428) SpO2:  [92 %-99 %] 99 % (11/20 0428) Weight:  [78.9 kg] 78.9 kg (11/19 1401)  Dressing: Packing in place.  Integument: Ulceration of R foot unchanged from 08/26/2024.  Vascular: R DP pulse palpable. R PT pulse palpable. Edema of R foot. Musculoskeletal: Calves soft, NT.  LAB/TEST RESULTS   Recent Labs    08/27/24 1652 08/28/24 0424  WBC 10.0 6.9  HGB 17.8* 16.0  HCT 53.4* 48.1  PLT 202 202  NA 140 142  K 3.2* 4.1  CL 101 105  CO2 25 27  BUN 18 16  CREATININE 1.18 1.16  GLUCOSE 100* 84  CALCIUM  9.3 8.9    Recent Results (from the past 240 hours)  Blood Cultures  x 2 sites     Status: None (Preliminary result)   Collection Time: 08/27/24  5:30 PM   Specimen: BLOOD  Result Value Ref Range Status   Specimen Description BLOOD BLOOD RIGHT ARM  Final   Special Requests NONE  Final   Culture   Final    NO GROWTH < 12 HOURS Performed at [facility], [address] [place], [place] [address]    Report Status PENDING  Incomplete  Blood Cultures x 2 sites     Status: None (Preliminary result)   Collection Time: 08/27/24  5:37 PM   Specimen: BLOOD  Result Value Ref Range Status   Specimen Description BLOOD BLOOD LEFT ARM  Final   Special Requests NONE  Final   Culture   Final    NO GROWTH < 12 HOURS Performed at [facility], [address] [place], [place] [address]    Report Status PENDING  Incomplete     DG Knee Complete 4 Views Right Result Date: 08/27/2024 CLINICAL DATA:  Infection  EXAM: RIGHT KNEE - COMPLETE 4+ VIEW COMPARISON:  None Available. FINDINGS: No evidence of fracture, dislocation, or joint effusion. 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 Soft tissues are unremarkable. IMPRESSION: 1. No acute fracture or dislocation. 2. Medial and lateral compartment chondrocalcinosis. Electronically Signed   By: Greig Pique M.D.   On: 08/27/2024 18:17   DG Foot 2 Views Right Result Date: 08/27/2024 EXAM: 1 or 2 VIEW(S) XRAY OF THE FOOT 08/27/2024 05:34:53 PM COMPARISON: None available. CLINICAL HISTORY: infection FINDINGS: BONES AND JOINTS: Screw within third Metatarsal head noted. Second Digit amputation at level of MTP joint. Chronic deformity of third through fifth Digits. Query erosion along plantar aspect of first Digit Proximal Phalanx base with concern for osteomyelitis. Degenerative changes of first Digit Metatarsophalangeal joint. SOFT TISSUES: Subcutaneous soft tissue edema of forefoot. Vascular calcifications. Ulceration over the ball of the foot. IMPRESSION: 1. Ulceration over the ball of the foot with concern for  osteomyelitis at the base of the proximal phalanx of the first digit. 2. Second digit amputation at the level of the MTP joint. 3. Screw within the third metatarsal head. Electronically signed by: Norleen Boxer MD 08/27/2024 06:11 PM EST RP Workstation: [id number]    ASSESSMENT & PLAN # Right foot osteomyelitis Recommend at least a partial first ray amputation, with possible resection of the second metatarsal head and possible Wound VAC placement. Surgery scheduled for tomorrow morning. Patient to remain NPO after midnight. Hold Lovenox  this evening. Patient understands and agrees with the course of care. Patient's son, James Gillespie, has been notify of the plan.  # Discharge planning Patient would benefit from skilled nursing placement following discharge due to fall risk and need for ongoing local wound care.  Morene LILLETTE Anon 08/28/2024, 9:47 AM

## 2024-08-29 ENCOUNTER — Inpatient Hospital Stay (HOSPITAL_COMMUNITY): Payer: Self-pay | Admitting: Certified Registered Nurse Anesthetist

## 2024-08-29 ENCOUNTER — Encounter (HOSPITAL_COMMUNITY): Payer: Self-pay | Admitting: Internal Medicine

## 2024-08-29 ENCOUNTER — Inpatient Hospital Stay (HOSPITAL_COMMUNITY)

## 2024-08-29 ENCOUNTER — Encounter (HOSPITAL_COMMUNITY)
Admission: EM | Disposition: A | Discharge status: Skilled Nursing Facility | Payer: Self-pay | Source: Ambulatory Visit | Attending: Hospitalist

## 2024-08-29 DIAGNOSIS — I129 Hypertensive chronic kidney disease with stage 1 through stage 4 chronic kidney disease, or unspecified chronic kidney disease: Secondary | ICD-10-CM | POA: Diagnosis not present

## 2024-08-29 DIAGNOSIS — E1169 Type 2 diabetes mellitus with other specified complication: Secondary | ICD-10-CM | POA: Diagnosis not present

## 2024-08-29 DIAGNOSIS — N183 Chronic kidney disease, stage 3 unspecified: Secondary | ICD-10-CM

## 2024-08-29 DIAGNOSIS — M869 Osteomyelitis, unspecified: Secondary | ICD-10-CM

## 2024-08-29 DIAGNOSIS — E1122 Type 2 diabetes mellitus with diabetic chronic kidney disease: Secondary | ICD-10-CM

## 2024-08-29 DIAGNOSIS — M86171 Other acute osteomyelitis, right ankle and foot: Secondary | ICD-10-CM | POA: Diagnosis not present

## 2024-08-29 DIAGNOSIS — I739 Peripheral vascular disease, unspecified: Secondary | ICD-10-CM

## 2024-08-29 DIAGNOSIS — Z8673 Personal history of transient ischemic attack (TIA), and cerebral infarction without residual deficits: Secondary | ICD-10-CM

## 2024-08-29 DIAGNOSIS — I10 Essential (primary) hypertension: Secondary | ICD-10-CM | POA: Diagnosis not present

## 2024-08-29 DIAGNOSIS — E785 Hyperlipidemia, unspecified: Secondary | ICD-10-CM

## 2024-08-29 HISTORY — PX: AMPUTATION: SHX166

## 2024-08-29 LAB — BASIC METABOLIC PANEL WITH GFR
Anion gap: 9 (ref 5–15)
BUN: 18 mg/dL (ref 8–23)
CO2: 26 mmol/L (ref 22–32)
Calcium: 8.3 mg/dL — ABNORMAL LOW (ref 8.9–10.3)
Chloride: 104 mmol/L (ref 98–111)
Creatinine, Ser: 1.14 mg/dL (ref 0.61–1.24)
GFR, Estimated: >60 mL/min
Glucose, Bld: 85 mg/dL (ref 70–99)
Potassium: 3.6 mmol/L (ref 3.5–5.1)
Sodium: 139 mmol/L (ref 135–145)

## 2024-08-29 LAB — CBC
HCT: 41.7 % (ref 39.0–52.0)
Hemoglobin: 13.8 g/dL (ref 13.0–17.0)
MCH: 32.2 pg (ref 26.0–34.0)
MCHC: 33.1 g/dL (ref 30.0–36.0)
MCV: 97.2 fL (ref 80.0–100.0)
Platelets: 185 10*3/uL (ref 150–400)
RBC: 4.29 MIL/uL (ref 4.22–5.81)
RDW: 15.4 % (ref 11.5–15.5)
WBC: 7.4 10*3/uL (ref 4.0–10.5)
nRBC: 0 % (ref 0.0–0.2)

## 2024-08-29 SURGERY — AMPUTATION, FOOT, PARTIAL
Anesthesia: General | Site: Foot | Laterality: Right

## 2024-08-29 MED ORDER — BUPIVACAINE HCL (PF) 0.5 % IJ SOLN
INTRAMUSCULAR | Status: DC | PRN
Start: 2024-08-29 — End: 2024-08-29
  Administered 2024-08-29: 20 mL

## 2024-08-29 MED ORDER — SODIUM CHLORIDE 0.9 % IR SOLN
Status: DC | PRN
Start: 2024-08-29 — End: 2024-08-29
  Administered 2024-08-29: 1000 mL

## 2024-08-29 MED ORDER — FENTANYL CITRATE (PF) 100 MCG/2ML IJ SOLN
INTRAMUSCULAR | Status: DC | PRN
  Administered 2024-08-29: 100 ug via INTRAVENOUS

## 2024-08-29 MED ORDER — LACTATED RINGERS IV SOLN: INTRAVENOUS | Status: DC

## 2024-08-29 MED ORDER — BUPIVACAINE HCL (PF) 0.5 % IJ SOLN
INTRAMUSCULAR | Status: AC
Start: 2024-08-29 — End: 2024-08-29
  Filled 2024-08-29: qty 30

## 2024-08-29 MED ORDER — ORAL CARE MOUTH RINSE: 15.0000 mL | Freq: Once | OROMUCOSAL | Status: AC

## 2024-08-29 MED ORDER — ZINC SULFATE 220 (50 ZN) MG PO CAPS
220.0000 mg | ORAL_CAPSULE | Freq: Every day | ORAL | Status: DC
  Administered 2024-08-29 – 2024-09-02 (×5): 220 mg via ORAL
  Filled 2024-08-29 (×7): qty 1

## 2024-08-29 MED ORDER — OXYCODONE HCL 5 MG/5ML PO SOLN: 5.0000 mg | Freq: Once | ORAL | Status: DC | PRN

## 2024-08-29 MED ORDER — MIDAZOLAM HCL 2 MG/2ML IJ SOLN
INTRAMUSCULAR | Status: AC
  Filled 2024-08-29: qty 2

## 2024-08-29 MED ORDER — LIDOCAINE 2% (20 MG/ML) 5 ML SYRINGE
INTRAMUSCULAR | Status: DC | PRN
  Administered 2024-08-29: 60 mg via INTRAVENOUS

## 2024-08-29 MED ORDER — CHLORHEXIDINE GLUCONATE 0.12 % MT SOLN
15.0000 mL | Freq: Once | OROMUCOSAL | Status: AC
  Administered 2024-08-29: 15 mL via OROMUCOSAL
  Filled 2024-08-29: qty 15

## 2024-08-29 MED ORDER — MIDAZOLAM HCL (PF) 2 MG/2ML IJ SOLN
INTRAMUSCULAR | Status: DC | PRN
  Administered 2024-08-29: 2 mg via INTRAVENOUS

## 2024-08-29 MED ORDER — DEXMEDETOMIDINE HCL IN NACL 80 MCG/20ML IV SOLN
INTRAVENOUS | Status: DC | PRN
  Administered 2024-08-29: 20 ug via INTRAVENOUS

## 2024-08-29 MED ORDER — OXYCODONE HCL 5 MG PO TABS: 5.0000 mg | ORAL_TABLET | Freq: Once | ORAL | Status: DC | PRN

## 2024-08-29 MED ORDER — PROPOFOL 500 MG/50ML IV EMUL
INTRAVENOUS | Status: DC | PRN
  Administered 2024-08-29: 80 mg via INTRAVENOUS

## 2024-08-29 MED ORDER — VITAMIN C 500 MG PO TABS
500.0000 mg | ORAL_TABLET | Freq: Every day | ORAL | Status: DC
  Administered 2024-08-29 – 2024-09-02 (×5): 500 mg via ORAL
  Filled 2024-08-29 (×7): qty 1

## 2024-08-29 MED ORDER — FENTANYL CITRATE (PF) 100 MCG/2ML IJ SOLN
INTRAMUSCULAR | Status: AC
  Filled 2024-08-29: qty 2

## 2024-08-29 MED ORDER — FENTANYL CITRATE (PF) 50 MCG/ML IJ SOSY: 25.0000 ug | PREFILLED_SYRINGE | INTRAMUSCULAR | Status: DC | PRN

## 2024-08-29 SURGICAL SUPPLY — 28 items
BANDAGE ESMARK 4X12 BL STRL LF (DISPOSABLE) ×2 IMPLANT
BLADE AVERAGE 25X9 (BLADE) IMPLANT
BLADE SURG 15 STRL LF DISP TIS (BLADE) ×4 IMPLANT
BNDG ELASTIC 4X5.8 VLCR NS LF (GAUZE/BANDAGES/DRESSINGS) ×2 IMPLANT
BNDG GAUZE DERMACEA FLUFF 4 (GAUZE/BANDAGES/DRESSINGS) ×2 IMPLANT
COVER LIGHT HANDLE STERIS (MISCELLANEOUS) ×4 IMPLANT
CUFF TOURN SGL QUICK 18X4 (TOURNIQUET CUFF) IMPLANT
DRSG ADAPTIC 3X8 NADH LF (GAUZE/BANDAGES/DRESSINGS) ×2 IMPLANT
DRSG TELFA 3X8 NADH STRL (GAUZE/BANDAGES/DRESSINGS) IMPLANT
ELECTRODE REM PT RTRN 9FT ADLT (ELECTROSURGICAL) ×2 IMPLANT
GAUZE SPONGE 4X4 12PLY STRL (GAUZE/BANDAGES/DRESSINGS) ×2 IMPLANT
GAUZE STRETCH 2X75IN STRL (MISCELLANEOUS) ×2 IMPLANT
GLOVE BIO SURGEON STRL SZ7.5 (GLOVE) ×2 IMPLANT
GLOVE BIOGEL PI IND STRL 7.0 (GLOVE) ×4 IMPLANT
GOWN STRL REUS W/TWL LRG LVL3 (GOWN DISPOSABLE) ×4 IMPLANT
MANIFOLD NEPTUNE II (INSTRUMENTS) ×2 IMPLANT
PACK BASIC LIMB (CUSTOM PROCEDURE TRAY) ×2 IMPLANT
PAD ABD 5X9 TENDERSORB (GAUZE/BANDAGES/DRESSINGS) IMPLANT
PAD ARMBOARD POSITIONER FOAM (MISCELLANEOUS) ×2 IMPLANT
POSITIONER HEAD 8X9X4 ADT (SOFTGOODS) ×2 IMPLANT
RASP SM TEAR CROSS CUT (RASP) IMPLANT
SET BASIN LINEN APH (SET/KITS/TRAYS/PACK) ×2 IMPLANT
SOLN 0.9% NACL POUR BTL 1000ML (IV SOLUTION) ×2 IMPLANT
SPONGE T-LAP 18X18 ~~LOC~~+RFID (SPONGE) ×2 IMPLANT
STRIP CLOSURE SKIN 1/2X4 (GAUZE/BANDAGES/DRESSINGS) ×2 IMPLANT
SUT PROLENE 3 0 PS 2 (SUTURE) IMPLANT
SWAB CULTURE ESWAB REG 1ML (MISCELLANEOUS) IMPLANT
SYR CONTROL 10ML LL (SYRINGE) ×4 IMPLANT

## 2024-08-29 NOTE — Op Note (Signed)
 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  POSTOPERATIVE DIAGNOSIS Same  PROCEDURE Partial 1st ray amputation, right foot  ANESTHESIA General   HEMOSTASIS Pneumatic ankle tourniquet set at 250 mmHg  ESTIMATED BLOOD LOSS Minimal (<5 cc)  MATERIALS USED None  INJECTABLES 0.5% Marcaine  plain  PATHOLOGY Specimen 1: Swab of soft tissue to microbiology for C&S Specimen 2: Bone from proximal phalanx to microbiology for C&S Specimen 3: Right hallux and distal aspect of 1st metatarsal to pathology Specimen 4: Wafer of bone from 1st metatarsal to pathology for margins  COMPLICATIONS None   INDICATIONS Patient with chronic neuropathic ulceration of the right foot complicated by cellulitis and abscess. Radiographic evidence of osteomyelitis involving the proximal phalanx and head of the first metatarsal of the right foot.  DESCRIPTION OF THE PROCEDURE Preparation and Draping: After induction of general anesthesia, the patient was positioned supine on the operating table. A pneumatic tourniquet was placed around the right ankle. The right foot was prepped and draped in the usual sterile fashion. The foot was anesthetized with 0.5% Marcaine . Incision: A racket-shaped incision was made over the first ray of the right foot. Description of Procedure: The incision was carried down to the level of the first metatarsophalangeal joint, which was disarticulated. Visible erosive changes of the first metatarsal and proximal phalanx were noted. The articular surface of the first metatarsal head had a  faint yellow color. The hallux was removed and passed from the operative field. A swab culture was obtained and sent to microbiology for culture and sensitivity. A section of bone from the proximal phalanx of the hallux was submitted for culture and sensitivity. An osteotomy was performed proximal to the surgical neck of the first metatarsal. The resected distal portion of the first metatarsal and sesamoids were removed and submitted to pathology for evaluation. A second wafer bone specimen was obtained from the remaining portion of the first metatarsal and sent to pathology for margin assessment. The full thickness ulceration previously noted along the plantar aspect of the first metatarsal was excised. Hemostasis: Hemostasis was achieved throughout the procedure. The tourniquet was deflated at the end of the procedure with prompt hyperemic response noted to all digits on the right foot. Closure: The skin was reapproximated using 3-0 Prolene sutures. The skin flap was rotated to allow for adequate wound closure. Dressings: A sterile compressive dressing was applied to the surgical site.

## 2024-08-29 NOTE — Progress Notes (Signed)
 PROGRESS NOTE    James Gillespie  FMW:[id number] DOB: 1950/12/16 DOA: 08/27/2024 PCP: [name], MD    Brief Narrative:  James Gillespie  is a 73 y.o. 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 - Patient presents to ED per podiatry instructions Dr. [name], patient with known right foot infection, which has been worsening, neuropathic, does not feel pain, he has been having large amount of drainage from his foot, had prior debridement in the office with packing in place, he was instructed to come to ED by his podiatrist as a need of amputation, refusing until this point--- had debridement in the office, and he finished Augmentin  and doxycycline without much improvement, x-ray in the office showing osteomyelitis, so patient was directed to come to ED.  11/21: Hemodynamically stable, s/p first ray amputation by podiatry today-tolerated the procedure well  Assessment and Plan: Right foot osteomyelitis - With known history of chronic right foot ulcer, sent by podiatry office given worsening wound and discharge . - X-ray significant for osteomyelitis.  Blood cultures remain negative, -S/p first ray amputation by podiatry today - Continue with IV antibiotics (IV Unasyn  and p.o. linezolid ),  -Follow-up intraoperative wound cultures - Follow-up podiatry recommendations   Right knee pain -x ray negative -lidocaine  patch -may need brace prior to PT eval   Hyperlipidemia - Continue with atorvastatin    Neuropathic pain - Continue with gabapentin    Alcohol abuse -  CIWA protocol   Depression - Continue with Lexapro    BPH - Continue with tamsulosin    Hypertension - Continue with losartan    Hypokalemia - Resolved with repletion -Continue to monitor   Permanent A-fib A-fib severe bradycardia -Status post single-chamber pacemaker 08/28/2022 - On Xarelto  for anticoagulation, request per podiatry to  hold in anticipation of surgery-keep on Lovenox  full dose  -Resume home Xarelto  once cleared from surgery.    History of CAD PVD CVA - He is supposed to be on aspirin, but not taking it so for now we will hold in anticipation of surgery, this can be resumed after surgery    Will need PT eval after surgery   DVT prophylaxis: Lovenox     Code Status: Full Code Family Communication: Discussed with patient  Disposition Plan:  Level of care: Telemetry Status is: Inpatient   Consultants:  podiatry  Subjective: Patient was seen after the surgery, denies any pain.  Objective: Vitals:   08/29/24 0945 08/29/24 1000 08/29/24 1015 08/29/24 1100  BP: (!) 142/83 (!) 143/83 (!) 145/85 (!) 151/92  Pulse: 60 61  62  Resp: 13 12 13 16   Temp:    (!) 97.5 F (36.4 C)  TempSrc:    Oral  SpO2: 97% 98%  96%  Weight:      Height:        Intake/Output Summary (Last 24 hours) at 08/29/2024 1509 Last data filed at 08/29/2024 1300 Gross per 24 hour  Intake 1239.94 ml  Output 552 ml  Net 687.94 ml   Filed Weights   08/27/24 1401 08/29/24 0701  Weight: 78.9 kg 78.9 kg   General.  Well-developed elderly man, in no acute distress. Pulmonary.  Lungs clear bilaterally, normal respiratory effort. CV.  Regular rate and rhythm, no JVD, rub or murmur. Abdomen.  Soft, nontender, nondistended, BS positive. CNS.  Alert and oriented .  No focal neurologic deficit. Extremities.  Right foot with bandage and Ace wrap Psychiatry.  Judgment and insight appears normal.  Data Reviewed: I have personally reviewed following labs and imaging studies  CBC: Recent Labs  Lab 08/27/24 1652 08/28/24 0424 08/29/24 0359  WBC 10.0 6.9 7.4  NEUTROABS 6.9  --   --   HGB 17.8* 16.0 13.8  HCT 53.4* 48.1 41.7  MCV 96.4 97.6 97.2  PLT 202 202 185   Basic Metabolic Panel: Recent Labs  Lab 08/27/24 1652 08/28/24 0424 08/29/24 0359  NA 140 142 139  K 3.2* 4.1 3.6  CL 101 105 104  CO2 25 27 26   GLUCOSE  100* 84 85  BUN 18 16 18   CREATININE 1.18 1.16 1.14  CALCIUM  9.3 8.9 8.3*   GFR: Estimated Creatinine Clearance: 60.5 mL/min (by C-G formula based on SCr of 1.14 mg/dL). Liver Function Tests: No results for input(s): AST, ALT, ALKPHOS, BILITOT, PROT, ALBUMIN in the last 168 hours. No results for input(s): LIPASE, AMYLASE in the last 168 hours. No results for input(s): AMMONIA in the last 168 hours. Coagulation Profile: No results for input(s): INR, PROTIME in the last 168 hours. Cardiac Enzymes: No results for input(s): CKTOTAL, CKMB, CKMBINDEX, TROPONINI in the last 168 hours. BNP (last 3 results) No results for input(s): PROBNP in the last 8760 hours. HbA1C: Recent Labs    08/27/24 1652  HGBA1C 5.8*   CBG: No results for input(s): GLUCAP in the last 168 hours. Lipid Profile: No results for input(s): CHOL, HDL, LDLCALC, TRIG, CHOLHDL, LDLDIRECT in the last 72 hours. Thyroid Function Tests: No results for input(s): TSH, T4TOTAL, FREET4, T3FREE, THYROIDAB in the last 72 hours. Anemia Panel: No results for input(s): VITAMINB12, FOLATE, FERRITIN, TIBC, IRON, RETICCTPCT in the last 72 hours. Sepsis Labs: Recent Labs  Lab 08/27/24 1730  LATICACIDVEN 2.0*    Recent Results (from the past 240 hours)  Blood Cultures x 2 sites     Status: None (Preliminary result)   Collection Time: 08/27/24  5:30 PM   Specimen: BLOOD  Result Value Ref Range Status   Specimen Description BLOOD BLOOD RIGHT ARM  Final   Special Requests NONE  Final   Culture   Final    NO GROWTH 2 DAYS Performed at [facility], [address], [place], [place] [address]    Report Status PENDING  Incomplete  Blood Cultures x 2 sites     Status: None (Preliminary result)   Collection Time: 08/27/24  5:37 PM   Specimen: BLOOD  Result Value Ref Range Status   Specimen Description BLOOD BLOOD LEFT ARM  Final   Special Requests NONE  Final    Culture   Final    NO GROWTH 2 DAYS Performed at [facility], [address], [place], [place] [address]    Report Status PENDING  Incomplete  Aerobic/Anaerobic Culture w Gram Stain (surgical/deep wound)     Status: None (Preliminary result)   Collection Time: 08/29/24  8:13 AM   Specimen: Path fluid; Body Fluid  Result Value Ref Range Status   Specimen Description   Final    TISSUE BONE Performed at [facility], [address], [place], [place] [address]    Special Requests   Final    NONE Performed at [facility], [address] [place], [place] [address]    Gram Stain PENDING  Incomplete   Culture PENDING  Incomplete   Report Status PENDING  Incomplete    Radiology Studies: DG Foot Complete Right Result Date: 08/29/2024 EXAM: 3 OR MORE VIEW(S) XRAY OF THE FOOT 08/29/2024 10:04:18 AM  COMPARISON: None available. CLINICAL HISTORY: 230731 Postoperative state 230731 FINDINGS: BONES AND JOINTS: Interval transmetatarsal amputation of the 1st digit. Prior 2nd toe amputation. Remote deformities and shortening of the 4th and 5th metatarsals distally. Remote deformity in the 3rd metatarsal distally with a screw in the head of the 3rd metatarsal. Hammertoe deformities in the remaining toes. Plantar calcaneal spur. No acute fracture. No joint dislocation. SOFT TISSUES: Gas noted in the soft tissues along the resection bed of the 1st digit. Vascular calcifications noted. IMPRESSION: 1. Interval transmetatarsal amputation of the 1st digit with gas in the soft tissues along the resection bed. 2. Prior 2nd toe amputation. 3. Remote deformities and shortening of the 4th and 5th metatarsals distally. 4. Remote deformity in the 3rd metatarsal distally with a screw in the head of the 3rd metatarsal. 5. Hammertoe deformities in the remaining toes. 6. Plantar calcaneal spur. 7. Vascular calcifications. Electronically signed by: Ryan Salvage MD 08/29/2024 11:47 AM EST RP Workstation: [id number]    DG Knee Complete 4 Views Right Result Date: 08/27/2024 CLINICAL DATA:  Infection EXAM: RIGHT KNEE - COMPLETE 4+ VIEW COMPARISON:  None Available. FINDINGS: No evidence of fracture, dislocation, or joint effusion. 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 Soft tissues are unremarkable. IMPRESSION: 1. No acute fracture or dislocation. 2. Medial and lateral compartment chondrocalcinosis. Electronically Signed   By: Greig Pique M.D.   On: 08/27/2024 18:17   DG Foot 2 Views Right Result Date: 08/27/2024 EXAM: 1 or 2 VIEW(S) XRAY OF THE FOOT 08/27/2024 05:34:53 PM COMPARISON: None available. CLINICAL HISTORY: infection FINDINGS: BONES AND JOINTS: Screw within third Metatarsal head noted. Second Digit amputation at level of MTP joint. Chronic deformity of third through fifth Digits. Query erosion along plantar aspect of first Digit Proximal Phalanx base with concern for osteomyelitis. Degenerative changes of first Digit Metatarsophalangeal joint. SOFT TISSUES: Subcutaneous soft tissue edema of forefoot. Vascular calcifications. Ulceration over the ball of the foot. IMPRESSION: 1. Ulceration over the ball of the foot with concern for osteomyelitis at the base of the proximal phalanx of the first digit. 2. Second digit amputation at the level of the MTP joint. 3. Screw within the third metatarsal head. Electronically signed by: Norleen Boxer MD 08/27/2024 06:11 PM EST RP Workstation: [id number]   Scheduled Meds:  amLODipine   10 mg Oral Daily   ascorbic acid   500 mg Oral Daily   atorvastatin   40 mg Oral QHS   escitalopram   5 mg Oral Daily   feeding supplement  237 mL Oral BID WC   folic acid   1 mg Oral Daily   gabapentin   200 mg Oral BID   hydrALAZINE   25 mg Oral BID   lidocaine   1 patch Transdermal Q24H   linezolid   600 mg Oral Q12H   losartan   50 mg Oral Daily   multivitamin with minerals  1 tablet Oral Daily   nutrition supplement (JUVEN)  1 packet Oral BID BM   tamsulosin   0.4  mg Oral QPC supper   thiamine   100 mg Oral Daily   Or   thiamine   100 mg Intravenous Daily   zinc  sulfate (50mg  elemental zinc )  220 mg Oral Daily   Continuous Infusions:  ampicillin -sulbactam (UNASYN ) IV 3 g (08/29/24 1123)     LOS: 2 days    Time spent: 45 minutes spent on chart review, discussion with nursing staff, consultants, updating family and interview/physical exam; more than 50% of that time was spent in counseling and/or coordination of care.  This record has been created using Conservation officer, historic buildings. Errors have been sought and corrected,but may not always be located. Such creation errors do not reflect on the standard of care.   Amaryllis Dare, MD [facility] Available via Epic secure chat 7am-7pm After these hours, please refer to coverage provider listed on [email or web address] 08/29/2024, 3:09 PM

## 2024-08-29 NOTE — TOC Progression Note (Signed)
 Transition of Care [facility]) - Progression Note    Patient Details  Name: James Gillespie MRN: [id number] Date of Birth: 1950/11/16  Transition of Care [facility]) CM/SW Contact  [name], LCSW Phone Number: 08/29/2024, 3:38 PM  Clinical Narrative:    CSW reviewed bed offers with pt. Pt accepted offer for [facility]. Pt's PASRR review still pending.   [facility] [address] [place], [place] [address] [phone] Overall rating ???? [facility] [address] [place], [place] [address] [phone] Overall rating ?? [facility] [address] [place], [place] [address] [phone] Overall rating ?????   Expected Discharge Plan: Skilled Nursing Facility Barriers to Discharge: Continued Medical Work up, SNF Pending bed offer               Expected Discharge Plan and Services In-house Referral: Clinical Social Work Discharge Planning Services: NA Post Acute Care Choice: Skilled Nursing Facility Living arrangements for the past 2 months: Single Family Home                 DME Arranged: N/A DME Agency: NA                   Social Drivers of Health (SDOH) Interventions SDOH Screenings   Food Insecurity: No Food Insecurity (02/14/2024)   Received from [facility]  Housing: Low Risk  (02/14/2024)   Received from [facility]  Transportation Needs: No Transportation Needs (04/11/2024)   Received from [facility]  Utilities: Not At Risk (02/14/2024)   Received from [facility] Resource Strain: Low Risk  (02/14/2024)   Received from [facility]  Physical Activity: Inactive (02/14/2024)   Received from [facility]  Social Connections: Moderately Isolated (02/14/2024)   Received from [facility]  Stress: No Stress Concern Present (02/14/2024)   Received from [facility]  Tobacco Use: Low Risk  (08/29/2024)  Health Literacy: Inadequate  Health Literacy (04/11/2024)   Received from [facility]    Readmission Risk Interventions    08/28/2024   11:08 AM  Readmission Risk Prevention Plan  Post Dischage Appt Complete  Medication Screening Complete  Transportation Screening Complete

## 2024-08-29 NOTE — Interval H&P Note (Signed)
 HISTORY AND PHYSICAL INTERVAL NOTE:  08/29/2024  7:22 AM  James Gillespie  has presented today for surgery, with the diagnosis of osteomeylitis of right foot.  The various methods of treatment have been discussed with the patient.  No guarantees were given.  After consideration of risks, benefits and other options for treatment, the patient has consented to surgery.  I have reviewed the patients' chart and labs.    Patient Vitals for the past 24 hrs:  BP Temp Temp src Pulse Resp SpO2 Height Weight  08/29/24 0704 (!) 142/79 97.8 F (36.6 C) -- -- 18 96 % -- --  08/29/24 0701 -- -- -- -- -- -- 5' 11 (1.803 m) 78.9 kg  08/29/24 0353 131/76 98 F (36.7 C) -- (!) 59 19 97 % -- --  08/28/24 2033 128/72 98.1 F (36.7 C) Oral 60 18 96 % -- --  08/28/24 1645 (!) 151/78 -- -- 98 14 -- -- --  08/28/24 1355 128/73 98.2 F (36.8 C) Oral 60 20 -- -- --    The patient was reexamined.  There have been no changes to this history and physical examination.  He has been NPO since midnight.  James Gillespie, DPM

## 2024-08-29 NOTE — Plan of Care (Signed)

## 2024-08-29 NOTE — Brief Op Note (Signed)
 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

## 2024-08-29 NOTE — Care Management Important Message (Signed)
 Important Message  Patient Details  Name: James Gillespie MRN: [id number] Date of Birth: Jan 10, 1951   Important Message Given:  Yes - Medicare IM     Linsy Ehresman L Milayna Rotenberg 08/29/2024, 3:03 PM

## 2024-08-29 NOTE — Transfer of Care (Signed)
 Immediate Anesthesia Transfer of Care Note  Patient: James Gillespie  Procedure(s) Performed: AMPUTATION, FOOT, PARTIAL (Right: Foot)  Patient Location: PACU  Anesthesia Type:General  Level of Consciousness: awake, drowsy, and patient cooperative  Airway & Oxygen Therapy: Patient Spontanous Breathing  Post-op Assessment: Report given to RN, Post -op Vital signs reviewed and stable, and Patient moving all extremities X 4  Post vital signs: Reviewed and stable  Last Vitals:  Vitals Value Taken Time  BP 121/79 08/29/24 09:15  Temp 36.8 C 08/29/24 09:14  Pulse 59 08/29/24 09:21  Resp 16 08/29/24 09:21  SpO2 94 % 08/29/24 09:21  Vitals shown include unfiled device data.  Last Pain:  Vitals:   08/29/24 0701  TempSrc:   PainSc: 0-No pain      Patients Stated Pain Goal: 1 (08/28/24 1645)  Complications: No notable events documented.

## 2024-08-29 NOTE — Anesthesia Procedure Notes (Signed)
 Procedure Name: LMA Insertion Date/Time: 08/29/2024 7:46 AM  Performed by: Cordella Elvie HERO, CRNAPre-anesthesia Checklist: Patient identified, Emergency Drugs available, Suction available, Patient being monitored and Timeout performed Patient Re-evaluated:Patient Re-evaluated prior to induction Oxygen Delivery Method: Circle system utilized Preoxygenation: Pre-oxygenation with 100% oxygen Induction Type: IV induction LMA: LMA with gastric port inserted LMA Size: 4.0 Number of attempts: 1 Placement Confirmation: positive ETCO2, CO2 detector and breath sounds checked- equal and bilateral Tube secured with: Tape Dental Injury: Teeth and Oropharynx as per pre-operative assessment

## 2024-08-29 NOTE — Anesthesia Postprocedure Evaluation (Signed)
 Anesthesia Post Note  Patient: James Gillespie  Procedure(s) Performed: AMPUTATION, FOOT, PARTIAL (Right: Foot)  Patient location during evaluation: PACU Anesthesia Type: General Level of consciousness: awake and alert Pain management: pain level controlled Vital Signs Assessment: post-procedure vital signs reviewed and stable Respiratory status: spontaneous breathing, nonlabored ventilation, respiratory function stable and patient connected to nasal cannula oxygen Cardiovascular status: blood pressure returned to baseline and stable Postop Assessment: no apparent nausea or vomiting Anesthetic complications: no   There were no known notable events for this encounter.   Last Vitals:  Vitals:   08/29/24 1000 08/29/24 1015  BP: (!) 143/83 (!) 145/85  Pulse: 61   Resp: 12 13  Temp:    SpO2: 98%     Last Pain:  Vitals:   08/29/24 1116  TempSrc:   PainSc: 4                  Winnifred Dufford L Onix Jumper

## 2024-08-30 ENCOUNTER — Encounter (HOSPITAL_COMMUNITY): Payer: Self-pay | Admitting: Podiatry

## 2024-08-30 DIAGNOSIS — M86171 Other acute osteomyelitis, right ankle and foot: Secondary | ICD-10-CM | POA: Diagnosis not present

## 2024-08-30 MED ORDER — SODIUM CHLORIDE 0.9 % IV SOLN: INTRAVENOUS | Status: AC | PRN

## 2024-08-30 MED ORDER — RIVAROXABAN 15 MG PO TABS
15.0000 mg | ORAL_TABLET | Freq: Every day | ORAL | Status: DC
  Administered 2024-08-30 – 2024-09-01 (×3): 15 mg via ORAL
  Filled 2024-08-30 (×3): qty 1

## 2024-08-30 MED ORDER — HYDROGEN PEROXIDE 3 % EX SOLN
Freq: Every day | CUTANEOUS | Status: DC | PRN
  Filled 2024-08-30: qty 473

## 2024-08-30 NOTE — Progress Notes (Signed)
 Requested to sit in  chair and was able to get up with assist of two and walker.  Wanted to get back to bed after about 10 minutes but stayed in chair about 20 minutes.

## 2024-08-30 NOTE — Progress Notes (Addendum)
 PODIATRY PROGRESS NOTE  SUBJECTIVE James Gillespie is a 73 year old male seen today POD 1 S/P partial 1st ray amputation of the right foot for osteomyelitis. Patient reports he is doing well with no pain at the surgical site. He denies any fevers, or chills. He has experienced one episode of emesis. Patient does complain of pain in his leg, specifically around the knee.   MEDICATIONS Scheduled Meds:  amLODipine   10 mg Oral Daily   ascorbic acid   500 mg Oral Daily   atorvastatin   40 mg Oral QHS   escitalopram   5 mg Oral Daily   feeding supplement  237 mL Oral BID WC   folic acid   1 mg Oral Daily   gabapentin   200 mg Oral BID   hydrALAZINE   25 mg Oral BID   lidocaine   1 patch Transdermal Q24H   linezolid   600 mg Oral Q12H   losartan   50 mg Oral Daily   multivitamin with minerals  1 tablet Oral Daily   nutrition supplement (JUVEN)  1 packet Oral BID BM   tamsulosin   0.4 mg Oral QPC supper   thiamine   100 mg Oral Daily   Or   thiamine   100 mg Intravenous Daily   zinc  sulfate (50mg  elemental zinc )  220 mg Oral Daily   Continuous Infusions:  ampicillin -sulbactam (UNASYN ) IV 3 g (08/30/24 0543)   PRN Meds:.acetaminophen  **OR** acetaminophen , albuterol , hydrALAZINE , LORazepam  **OR** LORazepam , morphine  injection, ondansetron  **OR** ondansetron  (ZOFRAN ) IV, oxyCODONE   OBJECTIVE Vital signs in last 24 hours:   Temp:  [97.7 F (36.5 C)-98.9 F (37.2 C)] 98.1 F (36.7 C) (11/22 0414) Pulse Rate:  [59-76] 62 (11/22 0414) Resp:  [15-17] 15 (11/22 0414) BP: (119-145)/(75-83) 145/83 (11/22 0414) SpO2:  [94 %-96 %] 95 % (11/22 0414)  General: Alert and oriented, in no acute distress. Dressing: Surgical dressing in place with moderate amount of blood strikethrough. Integument: Surgical site with sutures intact. Some swelling noted. All skin flaps appear viable. Musculoskeletal: Calves soft, NT.  LAB/TEST RESULTS  Recent Labs    08/28/24 0424 08/29/24 0359  WBC 6.9 7.4  HGB 16.0 13.8   HCT 48.1 41.7  PLT 202 185  NA 142 139  K 4.1 3.6  CL 105 104  CO2 27 26  BUN 16 18  CREATININE 1.16 1.14  GLUCOSE 84 85  CALCIUM  8.9 8.3*    Recent Results (from the past 240 hours)  Blood Cultures x 2 sites     Status: None (Preliminary result)   Collection Time: 08/27/24  5:30 PM   Specimen: BLOOD  Result Value Ref Range Status   Specimen Description BLOOD BLOOD RIGHT ARM  Final   Special Requests NONE  Final   Culture   Final    NO GROWTH 3 DAYS Performed at [facility], [address], [place], [place] [address]    Report Status PENDING  Incomplete  Blood Cultures x 2 sites     Status: None (Preliminary result)   Collection Time: 08/27/24  5:37 PM   Specimen: BLOOD  Result Value Ref Range Status   Specimen Description BLOOD BLOOD LEFT ARM  Final   Special Requests NONE  Final   Culture   Final    NO GROWTH 3 DAYS Performed at [facility], [address], [place], [place] [address]    Report Status PENDING  Incomplete  Aerobic/Anaerobic Culture w Gram Stain (surgical/deep wound)     Status: None (Preliminary result)   Collection Time: 08/29/24  8:13 AM  Specimen: Path fluid; Body Fluid  Result Value Ref Range Status   Specimen Description   Final    TISSUE BONE Performed at [facility], [address] [place], [place] [address]    Special Requests   Final    NONE Performed at [facility], [address] [place], [place] [address]    Gram Stain   Final    RARE WBC PRESENT, PREDOMINANTLY PMN NO ORGANISMS SEEN    Culture   Final    NO GROWTH < 24 HOURS Performed at [facility], [address] [place], [place] [address]    Report Status PENDING  Incomplete  Aerobic/Anaerobic Culture w Gram Stain (surgical/deep wound)     Status: None (Preliminary result)   Collection Time: 08/29/24  8:17 AM   Specimen: Path fluid  Result Value Ref Range Status   Specimen Description FLUID  Final   Special Requests NONE  Final   Gram Stain NO WBC  SEEN NO ORGANISMS SEEN   Final   Culture   Final    CULTURE REINCUBATED FOR BETTER GROWTH Performed at [facility], [address], [place], [place] [address]    Report Status PENDING  Incomplete     DG Foot Complete Right Result Date: 08/29/2024 EXAM: 3 OR MORE VIEW(S) XRAY OF THE FOOT 08/29/2024 10:04:18 AM COMPARISON: None available. CLINICAL HISTORY: 230731 Postoperative state 230731 FINDINGS: BONES AND JOINTS: Interval transmetatarsal amputation of the 1st digit. Prior 2nd toe amputation. Remote deformities and shortening of the 4th and 5th metatarsals distally. Remote deformity in the 3rd metatarsal distally with a screw in the head of the 3rd metatarsal. Hammertoe deformities in the remaining toes. Plantar calcaneal spur. No acute fracture. No joint dislocation. SOFT TISSUES: Gas noted in the soft tissues along the resection bed of the 1st digit. Vascular calcifications noted. IMPRESSION: 1. Interval transmetatarsal amputation of the 1st digit with gas in the soft tissues along the resection bed. 2. Prior 2nd toe amputation. 3. Remote deformities and shortening of the 4th and 5th metatarsals distally. 4. Remote deformity in the 3rd metatarsal distally with a screw in the head of the 3rd metatarsal. 5. Hammertoe deformities in the remaining toes. 6. Plantar calcaneal spur. 7. Vascular calcifications. Electronically signed by: Ryan Salvage MD 08/29/2024 11:47 AM EST RP Workstation: [id number]    ASSESSMENT & PLAN This is a 73 year old male POD 1 S/P partial 1st ray amputation of the right foot, currently with expected post-operative course and no signs of complications.  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 All skin flaps viable. Sutures intact. Will continue daily dressing changes. Patient to remain non-weight bearing on the affected extremity. Will monitor for  signs of infection or wound dehiscence. If any areas fail to close, may consider wound VAC application to those specific areas. May resume anticoagulation. Will change dressing tomorrow. If progressing well, will clear for transfer to SNF from my standpoint.  # Leg/knee pain Patient reports knee pain. X-ray shows chondrocalcinosis. No acute findings. Will monitor symptoms. May consider orthopedic consultation as outpatient if symptoms persist after discharge.  Morene LILLETTE Anon 08/30/2024, 11:02 AM

## 2024-08-30 NOTE — Plan of Care (Signed)

## 2024-08-30 NOTE — Progress Notes (Addendum)
 C/O right leg pain but says right foot surgical site is not painful.  Dr. [name] changed dressing to right foot and was pleased with assessment, stating that the amount of blood on old dressing was to be expected.  He will change dressing tomorrow, as well. Patient vomited before lunch and was given zofran  .  Sister and brother-in-law at bedside now. Encouraged to sit in chair and stated he did not want to get up.

## 2024-08-30 NOTE — Progress Notes (Signed)
 PROGRESS NOTE    James Gillespie  FMW:[id number] DOB: January 26, 1951 DOA: 08/27/2024 PCP: [name], MD    Brief Narrative:  James Gillespie  is a 73 y.o. 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 - Patient presents to ED per podiatry instructions Dr. [name], patient with known right foot infection, which has been worsening, neuropathic, does not feel pain, he has been having large amount of drainage from his foot, had prior debridement in the office with packing in place, he was instructed to come to ED by his podiatrist as a need of amputation, refusing until this point--- had debridement in the office, and he finished Augmentin  and doxycycline without much improvement, x-ray in the office showing osteomyelitis, so patient was directed to come to ED.  11/21: Hemodynamically stable, s/p first ray amputation by podiatry today-tolerated the procedure well  Assessment and Plan: Right foot osteomyelitis - With known history of chronic right foot ulcer, sent by podiatry office given worsening wound and discharge . - X-ray significant for osteomyelitis.  Blood cultures remain negative, -S/p first ray amputation by podiatry 11/21 - Continue with IV antibiotics (IV Unasyn  and p.o. linezolid ),  -Follow-up intraoperative wound cultures, so far negative. - Follow-up podiatry recommendations   Right knee pain -x ray negative -lidocaine  patch -may need brace prior to PT eval   Hyperlipidemia - Continue with atorvastatin    Neuropathic pain - Continue with gabapentin    Alcohol abuse -  CIWA protocol   Depression - Continue with Lexapro    BPH - Continue with tamsulosin    Hypertension - Continue with losartan    Hypokalemia - Resolved with repletion -Continue to monitor   Permanent A-fib A-fib severe bradycardia -Status post single-chamber pacemaker 08/28/2022 - Resume Xarelto    History of  CAD PVD CVA - He is supposed to be on aspirin, but not taking it so for now we will hold in anticipation of surgery, this can be resumed after surgery    Will need PT eval after surgery   DVT prophylaxis: Lovenox     Code Status: Full Code Family Communication: Discussed with patient  Disposition Plan:  Level of care: Telemetry Status is: Inpatient   Consultants:  podiatry  Subjective: Patient was seen after the surgery, denies any pain.  Vital signs are stable.  Objective: Vitals:   08/29/24 1100 08/29/24 1530 08/29/24 2015 08/30/24 0414  BP: (!) 151/92 119/82 (!) 140/75 (!) 145/83  Pulse: 62 76 (!) 59 62  Resp: 16 17 16 15   Temp: (!) 97.5 F (36.4 C) 97.7 F (36.5 C) 98.9 F (37.2 C) 98.1 F (36.7 C)  TempSrc: Oral Oral Oral Oral  SpO2: 96% 96% 94% 95%  Weight:      Height:        Intake/Output Summary (Last 24 hours) at 08/30/2024 1102 Last data filed at 08/30/2024 1000 Gross per 24 hour  Intake 1126.89 ml  Output 600 ml  Net 526.89 ml   Filed Weights   08/27/24 1401 08/29/24 0701  Weight: 78.9 kg 78.9 kg   General.  Well-developed elderly man, in no acute distress. Pulmonary.  Lungs clear bilaterally, normal respiratory effort. CV.  Regular rate and rhythm, no JVD, rub or murmur. Abdomen.  Soft, nontender, nondistended, BS positive. CNS.  Alert and oriented .  No focal neurologic deficit. Extremities.  Right foot with bandage and Ace wrap, bloody stain noted in the dressing Psychiatry.  Judgment and insight appears normal.  Data Reviewed: I have personally reviewed following labs and imaging studies  CBC: Recent Labs  Lab 08/27/24 1652 08/28/24 0424 08/29/24 0359  WBC 10.0 6.9 7.4  NEUTROABS 6.9  --   --   HGB 17.8* 16.0 13.8  HCT 53.4* 48.1 41.7  MCV 96.4 97.6 97.2  PLT 202 202 185   Basic Metabolic Panel: Recent Labs  Lab 08/27/24 1652 08/28/24 0424 08/29/24 0359  NA 140 142 139  K 3.2* 4.1 3.6  CL 101 105 104  CO2 25 27 26    GLUCOSE 100* 84 85  BUN 18 16 18   CREATININE 1.18 1.16 1.14  CALCIUM  9.3 8.9 8.3*   GFR: Estimated Creatinine Clearance: 60.5 mL/min (by C-G formula based on SCr of 1.14 mg/dL). Liver Function Tests: No results for input(s): AST, ALT, ALKPHOS, BILITOT, PROT, ALBUMIN in the last 168 hours. No results for input(s): LIPASE, AMYLASE in the last 168 hours. No results for input(s): AMMONIA in the last 168 hours. Coagulation Profile: No results for input(s): INR, PROTIME in the last 168 hours. Cardiac Enzymes: No results for input(s): CKTOTAL, CKMB, CKMBINDEX, TROPONINI in the last 168 hours. BNP (last 3 results) No results for input(s): PROBNP in the last 8760 hours. HbA1C: Recent Labs    08/27/24 1652  HGBA1C 5.8*   CBG: No results for input(s): GLUCAP in the last 168 hours. Lipid Profile: No results for input(s): CHOL, HDL, LDLCALC, TRIG, CHOLHDL, LDLDIRECT in the last 72 hours. Thyroid Function Tests: No results for input(s): TSH, T4TOTAL, FREET4, T3FREE, THYROIDAB in the last 72 hours. Anemia Panel: No results for input(s): VITAMINB12, FOLATE, FERRITIN, TIBC, IRON, RETICCTPCT in the last 72 hours. Sepsis Labs: Recent Labs  Lab 08/27/24 1730  LATICACIDVEN 2.0*    Recent Results (from the past 240 hours)  Blood Cultures x 2 sites     Status: None (Preliminary result)   Collection Time: 08/27/24  5:30 PM   Specimen: BLOOD  Result Value Ref Range Status   Specimen Description BLOOD BLOOD RIGHT ARM  Final   Special Requests NONE  Final   Culture   Final    NO GROWTH 3 DAYS Performed at [facility], [address], [place], [place] [address]    Report Status PENDING  Incomplete  Blood Cultures x 2 sites     Status: None (Preliminary result)   Collection Time: 08/27/24  5:37 PM   Specimen: BLOOD  Result Value Ref Range Status   Specimen Description BLOOD BLOOD LEFT ARM  Final   Special Requests NONE   Final   Culture   Final    NO GROWTH 3 DAYS Performed at [facility], [address], [place], [place] [address]    Report Status PENDING  Incomplete  Aerobic/Anaerobic Culture w Gram Stain (surgical/deep wound)     Status: None (Preliminary result)   Collection Time: 08/29/24  8:13 AM   Specimen: Path fluid; Body Fluid  Result Value Ref Range Status   Specimen Description   Final    TISSUE BONE Performed at [facility], [address] [place], [place] [address]    Special Requests   Final    NONE Performed at [facility], [address], [place], [place] [address]    Gram Stain   Final    RARE WBC PRESENT, PREDOMINANTLY PMN NO ORGANISMS SEEN    Culture   Final    NO GROWTH < 24 HOURS Performed at [facility], [address] [place],  [place] [address]    Report Status PENDING  Incomplete  Aerobic/Anaerobic Culture w Gram Stain (surgical/deep wound)     Status: None (Preliminary result)   Collection Time: 08/29/24  8:17 AM   Specimen: Path fluid  Result Value Ref Range Status   Specimen Description FLUID  Final   Special Requests NONE  Final   Gram Stain NO WBC SEEN NO ORGANISMS SEEN   Final   Culture   Final    CULTURE REINCUBATED FOR BETTER GROWTH Performed at [facility], [address], [place], [place] [address]    Report Status PENDING  Incomplete    Radiology Studies: DG Foot Complete Right Result Date: 08/29/2024 EXAM: 3 OR MORE VIEW(S) XRAY OF THE FOOT 08/29/2024 10:04:18 AM COMPARISON: None available. CLINICAL HISTORY: 230731 Postoperative state 230731 FINDINGS: BONES AND JOINTS: Interval transmetatarsal amputation of the 1st digit. Prior 2nd toe amputation. Remote deformities and shortening of the 4th and 5th metatarsals distally. Remote deformity in the 3rd metatarsal distally with a screw in the head of the 3rd metatarsal. Hammertoe deformities in the remaining toes. Plantar calcaneal spur. No acute fracture. No joint dislocation. SOFT  TISSUES: Gas noted in the soft tissues along the resection bed of the 1st digit. Vascular calcifications noted. IMPRESSION: 1. Interval transmetatarsal amputation of the 1st digit with gas in the soft tissues along the resection bed. 2. Prior 2nd toe amputation. 3. Remote deformities and shortening of the 4th and 5th metatarsals distally. 4. Remote deformity in the 3rd metatarsal distally with a screw in the head of the 3rd metatarsal. 5. Hammertoe deformities in the remaining toes. 6. Plantar calcaneal spur. 7. Vascular calcifications. Electronically signed by: Ryan Salvage MD 08/29/2024 11:47 AM EST RP Workstation: [id number]   Scheduled Meds:  amLODipine   10 mg Oral Daily   ascorbic acid   500 mg Oral Daily   atorvastatin   40 mg Oral QHS   escitalopram   5 mg Oral Daily   feeding supplement  237 mL Oral BID WC   folic acid   1 mg Oral Daily   gabapentin   200 mg Oral BID   hydrALAZINE   25 mg Oral BID   lidocaine   1 patch Transdermal Q24H   linezolid   600 mg Oral Q12H   losartan   50 mg Oral Daily   multivitamin with minerals  1 tablet Oral Daily   nutrition supplement (JUVEN)  1 packet Oral BID BM   tamsulosin   0.4 mg Oral QPC supper   thiamine   100 mg Oral Daily   Or   thiamine   100 mg Intravenous Daily   zinc  sulfate (50mg  elemental zinc )  220 mg Oral Daily   Continuous Infusions:  ampicillin -sulbactam (UNASYN ) IV 3 g (08/30/24 0543)     LOS: 3 days    Time spent: 45 minutes spent on chart review, discussion with nursing staff, consultants, updating family and interview/physical exam; more than 50% of that time was spent in counseling and/or coordination of care.  This record has been created using Conservation officer, historic buildings. Errors have been sought and corrected,but may not always be located. Such creation errors do not reflect on the standard of care.   Derryl Duval, MD [facility] Available via Epic secure chat 7am-7pm After these hours, please refer to  coverage provider listed on [email or web address] 08/30/2024, 11:02 AM

## 2024-08-31 DIAGNOSIS — M86171 Other acute osteomyelitis, right ankle and foot: Secondary | ICD-10-CM | POA: Diagnosis not present

## 2024-08-31 LAB — CBC WITH DIFFERENTIAL/PLATELET
Abs Immature Granulocytes: 0.09 K/uL — ABNORMAL HIGH (ref 0.00–0.07)
Basophils Absolute: 0 K/uL (ref 0.0–0.1)
Basophils Relative: 0 %
Eosinophils Absolute: 0.3 K/uL (ref 0.0–0.5)
Eosinophils Relative: 3 %
HCT: 41 % (ref 39.0–52.0)
Hemoglobin: 13.4 g/dL (ref 13.0–17.0)
Immature Granulocytes: 1 %
Lymphocytes Relative: 17 %
Lymphs Abs: 1.4 K/uL (ref 0.7–4.0)
MCH: 32.2 pg (ref 26.0–34.0)
MCHC: 32.7 g/dL (ref 30.0–36.0)
MCV: 98.6 fL (ref 80.0–100.0)
Monocytes Absolute: 0.8 K/uL (ref 0.1–1.0)
Monocytes Relative: 9 %
Neutro Abs: 5.9 K/uL (ref 1.7–7.7)
Neutrophils Relative %: 70 %
Platelets: 184 K/uL (ref 150–400)
RBC: 4.16 MIL/uL — ABNORMAL LOW (ref 4.22–5.81)
RDW: 15.3 % (ref 11.5–15.5)
WBC: 8.6 K/uL (ref 4.0–10.5)
nRBC: 0 % (ref 0.0–0.2)

## 2024-08-31 LAB — BASIC METABOLIC PANEL WITH GFR
Anion gap: 9 (ref 5–15)
BUN: 25 mg/dL — ABNORMAL HIGH (ref 8–23)
CO2: 24 mmol/L (ref 22–32)
Calcium: 8.5 mg/dL — ABNORMAL LOW (ref 8.9–10.3)
Chloride: 103 mmol/L (ref 98–111)
Creatinine, Ser: 1.17 mg/dL (ref 0.61–1.24)
GFR, Estimated: >60 mL/min (ref >60)
Glucose, Bld: 83 mg/dL (ref 70–99)
Potassium: 4.4 mmol/L (ref 3.5–5.1)
Sodium: 136 mmol/L (ref 135–145)

## 2024-08-31 MED ORDER — SODIUM CHLORIDE 0.9 % IV SOLN
3.0000 g | Freq: Four times a day (QID) | INTRAVENOUS | Status: DC
  Administered 2024-08-31 – 2024-09-02 (×8): 3 g via INTRAVENOUS
  Filled 2024-08-31 (×8): qty 8

## 2024-08-31 NOTE — Progress Notes (Addendum)
 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 He is doing well with no pain at the surgical site. He denies any fevers, or chills. He has experienced some nausea today.  MEDICATIONS Scheduled Meds:  amLODipine   10 mg Oral Daily   ascorbic acid   500 mg Oral Daily   atorvastatin   40 mg Oral QHS   escitalopram   5 mg Oral Daily   feeding supplement  237 mL Oral BID WC   folic acid   1 mg Oral Daily   gabapentin   200 mg Oral BID   hydrALAZINE   25 mg Oral BID   lidocaine   1 patch Transdermal Q24H   linezolid   600 mg Oral Q12H   losartan   50 mg Oral Daily   multivitamin with minerals  1 tablet Oral Daily   nutrition supplement (JUVEN)  1 packet Oral BID BM   Rivaroxaban   15 mg Oral Q supper   tamsulosin   0.4 mg Oral QPC supper   thiamine   100 mg Oral Daily   Or   thiamine   100 mg Intravenous Daily   zinc  sulfate (50mg  elemental zinc )  220 mg Oral Daily   Continuous Infusions:  sodium chloride  Stopped (08/30/24 1811)   ampicillin -sulbactam (UNASYN ) IV 3 g (08/31/24 1039)   PRN Meds:.sodium chloride , acetaminophen  **OR** acetaminophen , albuterol , hydrALAZINE , hydrogen peroxide , morphine  injection, ondansetron  **OR** ondansetron  (ZOFRAN ) IV, oxyCODONE   OBJECTIVE Vital signs in last 24 hours:   Temp:  [98 F (36.7 C)-98.9 F (37.2 C)] 98 F (36.7 C) (11/23 0535) Pulse Rate:  [59-60] 60 (11/23 0535) Resp:  [18] 18 (11/23 0535) BP: (128-142)/(75-90) 142/76 (11/23 0535) SpO2:  [93 %-95 %] 94 % (11/23 0535)  General: Alert and oriented, in no acute distress. Dressing: Surgical dressing in place with mild amount of blood strikethrough. Integument: Surgical site with sutures intact. All skin flaps appear viable. No erythema. Musculoskeletal: Calves soft, NT.  LAB/TEST RESULTS  Recent Labs    08/29/24 0359 08/31/24 0428  WBC 7.4 8.6  HGB 13.8 13.4  HCT 41.7 41.0  PLT 185  184  NA 139 136  K 3.6 4.4  CL 104 103  CO2 26 24  BUN 18 25*  CREATININE 1.14 1.17  GLUCOSE 85 83  CALCIUM  8.3* 8.5*    Recent Results (from the past 240 hours)  Blood Cultures x 2 sites     Status: None (Preliminary result)   Collection Time: 08/27/24  5:30 PM   Specimen: BLOOD  Result Value Ref Range Status   Specimen Description BLOOD BLOOD RIGHT ARM  Final   Special Requests NONE  Final   Culture   Final    NO GROWTH 4 DAYS Performed at [facility], [address] [place], [place] [address]    Report Status PENDING  Incomplete  Blood Cultures x 2 sites     Status: None (Preliminary result)   Collection Time: 08/27/24  5:37 PM   Specimen: BLOOD  Result Value Ref Range Status   Specimen Description BLOOD BLOOD LEFT ARM  Final   Special Requests NONE  Final   Culture   Final    NO GROWTH 4 DAYS Performed at [facility], [address], [place], [place] [address]    Report Status PENDING  Incomplete  Aerobic/Anaerobic Culture w Gram Stain (surgical/deep wound)     Status: None (Preliminary result)   Collection Time: 08/29/24  8:13 AM  Specimen: Path fluid; Body Fluid  Result Value Ref Range Status   Specimen Description   Final    TISSUE BONE Performed at [facility], [address], [place], [place] [address]    Special Requests   Final    NONE Performed at [facility], [address], [place], [place] [address]    Gram Stain   Final    RARE WBC PRESENT, PREDOMINANTLY PMN NO ORGANISMS SEEN    Culture   Final    NO GROWTH 2 DAYS Performed at [facility], [address] [place], [place] [address]    Report Status PENDING  Incomplete  Aerobic/Anaerobic Culture w Gram Stain (surgical/deep wound)     Status: None (Preliminary result)   Collection Time: 08/29/24  8:17 AM   Specimen: Path fluid  Result Value Ref Range Status   Specimen Description FLUID  Final   Special Requests NONE  Final   Gram Stain   Final    NO WBC SEEN NO ORGANISMS  SEEN Performed at [facility], [address], [place], [place] [address]    Culture FEW ENTEROCOCCUS FAECALIS  Final   Report Status PENDING  Incomplete   Organism ID, Bacteria ENTEROCOCCUS FAECALIS  Final      Susceptibility   Enterococcus faecalis - MIC*    AMPICILLIN  <=2 SENSITIVE Sensitive     VANCOMYCIN  1 SENSITIVE Sensitive     GENTAMICIN SYNERGY RESISTANT Resistant     * FEW ENTEROCOCCUS FAECALIS     No results found.   ASSESSMENT & PLAN This is a 73 year old male POD 2 S/P partial 1st ray amputation of the right foot, currently with expected post-operative course and no signs of complications.  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 Dressing changed. Cleared for transition to SNF from my standpoint. Patient to follow-up with me at my [place] office one week following transfer.  Morene LILLETTE Anon 08/31/2024, 11:36 AM

## 2024-08-31 NOTE — Plan of Care (Signed)

## 2024-08-31 NOTE — Progress Notes (Signed)
 PROGRESS NOTE    James Gillespie  FMW:[id number] DOB: Apr 26, 1951 DOA: 08/27/2024 PCP: [name], MD    Brief Narrative:  James Gillespie  is a 73 y.o. 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 - Patient presents to ED per podiatry instructions Dr. [name], patient with known right foot infection, which has been worsening, neuropathic, does not feel pain, he has been having large amount of drainage from his foot, had prior debridement in the office with packing in place, he was instructed to come to ED by his podiatrist as a need of amputation, refusing until this point--- had debridement in the office, and he finished Augmentin  and doxycycline without much improvement, x-ray in the office showing osteomyelitis, so patient was directed to come to ED.  11/21: Hemodynamically stable, s/p first ray amputation by podiatry today-tolerated the procedure well  Assessment and Plan: Right foot osteomyelitis - With known history of chronic right foot ulcer, sent by podiatry office given worsening wound and discharge . - X-ray significant for osteomyelitis.   -S/p first ray amputation by podiatry 11/21 -Blood cultures negative, intraoperative culture positive for Enterococcus faecalis, susceptibilities pending.  Surgical path pending - Continue with IV antibiotics (IV Unasyn  and p.o. linezolid ),  - Patient is stable from podiatry standpoint for discharge to SNF -NWB RLE  Right knee pain -x ray negative -lidocaine  patch -may need brace prior to PT eval   Hyperlipidemia - Continue with atorvastatin    Neuropathic pain - Continue with gabapentin    Alcohol abuse -  CIWA protocol   Depression - Continue with Lexapro    BPH - Continue with tamsulosin    Hypertension - Continue with losartan    Hypokalemia - Resolved with repletion -Continue to monitor   Permanent A-fib A-fib severe bradycardia -Status  post single-chamber pacemaker 08/28/2022 - Resume Xarelto    History of CAD PVD CVA - He is supposed to be on aspirin, but not taking it so for now we will hold in anticipation of surgery, this can be resumed after surgery      DVT prophylaxis: Lovenox  Rivaroxaban  (XARELTO ) tablet 15 mg    Code Status: Full Code Family Communication: Discussed with patient  Disposition Plan: [facility], PASRR review pending Level of care: Telemetry Status is: Inpatient   Consultants:  podiatry  Subjective: Patient seen and examined at the bedside.  He reports of no significant pain on his right foot.  Vital signs are stable.  Follow-up evaluation with podiatry today, dressing changed.  He is cleared for discharge from podiatry standpoint.  Objective: Vitals:   08/30/24 1343 08/30/24 2034 08/31/24 0535 08/31/24 1344  BP: 130/75 (!) 128/90 (!) 142/76 137/68  Pulse: (!) 59 (!) 59 60 62  Resp: 18 18 18 20   Temp: 98.9 F (37.2 C) 98.2 F (36.8 C) 98 F (36.7 C) 98.3 F (36.8 C)  TempSrc: Oral Oral Oral Oral  SpO2: 95% 93% 94% 94%  Weight:      Height:        Intake/Output Summary (Last 24 hours) at 08/31/2024 1406 Last data filed at 08/31/2024 1344 Gross per 24 hour  Intake 1071.8 ml  Output 925 ml  Net 146.8 ml   Filed Weights   08/27/24 1401 08/29/24 0701  Weight: 78.9 kg 78.9 kg   General.  Well-developed elderly man, in no acute distress. Pulmonary.  Lungs clear bilaterally, normal respiratory effort. CV.  Regular rate and rhythm, no JVD, rub or murmur.  Abdomen.  Soft, nontender, nondistended, BS positive. CNS.  Alert and oriented .  No focal neurologic deficit. Extremities.  Right foot with bandage and Ace wrap, bloody stain noted in the dressing Psychiatry.  Judgment and insight appears normal.   Data Reviewed: I have personally reviewed following labs and imaging studies  CBC: Recent Labs  Lab 08/27/24 1652 08/28/24 0424 08/29/24 0359 08/31/24 0428  WBC 10.0 6.9  7.4 8.6  NEUTROABS 6.9  --   --  5.9  HGB 17.8* 16.0 13.8 13.4  HCT 53.4* 48.1 41.7 41.0  MCV 96.4 97.6 97.2 98.6  PLT 202 202 185 184   Basic Metabolic Panel: Recent Labs  Lab 08/27/24 1652 08/28/24 0424 08/29/24 0359 08/31/24 0428  NA 140 142 139 136  K 3.2* 4.1 3.6 4.4  CL 101 105 104 103  CO2 25 27 26 24   GLUCOSE 100* 84 85 83  BUN 18 16 18  25*  CREATININE 1.18 1.16 1.14 1.17  CALCIUM  9.3 8.9 8.3* 8.5*   GFR: Estimated Creatinine Clearance: 59 mL/min (by C-G formula based on SCr of 1.17 mg/dL). Liver Function Tests: No results for input(s): AST, ALT, ALKPHOS, BILITOT, PROT, ALBUMIN in the last 168 hours. No results for input(s): LIPASE, AMYLASE in the last 168 hours. No results for input(s): AMMONIA in the last 168 hours. Coagulation Profile: No results for input(s): INR, PROTIME in the last 168 hours. Cardiac Enzymes: No results for input(s): CKTOTAL, CKMB, CKMBINDEX, TROPONINI in the last 168 hours. BNP (last 3 results) No results for input(s): PROBNP in the last 8760 hours. HbA1C: No results for input(s): HGBA1C in the last 72 hours.  CBG: No results for input(s): GLUCAP in the last 168 hours. Lipid Profile: No results for input(s): CHOL, HDL, LDLCALC, TRIG, CHOLHDL, LDLDIRECT in the last 72 hours. Thyroid Function Tests: No results for input(s): TSH, T4TOTAL, FREET4, T3FREE, THYROIDAB in the last 72 hours. Anemia Panel: No results for input(s): VITAMINB12, FOLATE, FERRITIN, TIBC, IRON, RETICCTPCT in the last 72 hours. Sepsis Labs: Recent Labs  Lab 08/27/24 1730  LATICACIDVEN 2.0*    Recent Results (from the past 240 hours)  Blood Cultures x 2 sites     Status: None (Preliminary result)   Collection Time: 08/27/24  5:30 PM   Specimen: BLOOD  Result Value Ref Range Status   Specimen Description BLOOD BLOOD RIGHT ARM  Final   Special Requests NONE  Final   Culture   Final    NO  GROWTH 4 DAYS Performed at [facility], [address], [place], [place] [address]    Report Status PENDING  Incomplete  Blood Cultures x 2 sites     Status: None (Preliminary result)   Collection Time: 08/27/24  5:37 PM   Specimen: BLOOD  Result Value Ref Range Status   Specimen Description BLOOD BLOOD LEFT ARM  Final   Special Requests NONE  Final   Culture   Final    NO GROWTH 4 DAYS Performed at [facility], [address] [place], [place] [address]    Report Status PENDING  Incomplete  Aerobic/Anaerobic Culture w Gram Stain (surgical/deep wound)     Status: None (Preliminary result)   Collection Time: 08/29/24  8:13 AM   Specimen: Path fluid; Body Fluid  Result Value Ref Range Status   Specimen Description   Final    TISSUE BONE Performed at [facility], [address], [place], [place] [address]    Special Requests   Final  NONE Performed at [facility], [address] [place], [place] [address]    Gram Stain   Final    RARE WBC PRESENT, PREDOMINANTLY PMN NO ORGANISMS SEEN    Culture   Final    NO GROWTH 2 DAYS Performed at [facility], [address] [place], [place] [address]    Report Status PENDING  Incomplete  Aerobic/Anaerobic Culture w Gram Stain (surgical/deep wound)     Status: None (Preliminary result)   Collection Time: 08/29/24  8:17 AM   Specimen: Path fluid  Result Value Ref Range Status   Specimen Description FLUID  Final   Special Requests NONE  Final   Gram Stain   Final    NO WBC SEEN NO ORGANISMS SEEN Performed at [facility], [address] [place], [place] [address]    Culture FEW ENTEROCOCCUS FAECALIS  Final   Report Status PENDING  Incomplete   Organism ID, Bacteria ENTEROCOCCUS FAECALIS  Final      Susceptibility   Enterococcus faecalis - MIC*    AMPICILLIN  <=2 SENSITIVE Sensitive     VANCOMYCIN  1 SENSITIVE Sensitive     GENTAMICIN SYNERGY RESISTANT Resistant     * FEW ENTEROCOCCUS FAECALIS    Radiology  Studies: No results found.  Scheduled Meds:  amLODipine   10 mg Oral Daily   ascorbic acid   500 mg Oral Daily   atorvastatin   40 mg Oral QHS   escitalopram   5 mg Oral Daily   feeding supplement  237 mL Oral BID WC   folic acid   1 mg Oral Daily   gabapentin   200 mg Oral BID   hydrALAZINE   25 mg Oral BID   lidocaine   1 patch Transdermal Q24H   linezolid   600 mg Oral Q12H   losartan   50 mg Oral Daily   multivitamin with minerals  1 tablet Oral Daily   nutrition supplement (JUVEN)  1 packet Oral BID BM   Rivaroxaban   15 mg Oral Q supper   tamsulosin   0.4 mg Oral QPC supper   thiamine   100 mg Oral Daily   Or   thiamine   100 mg Intravenous Daily   zinc  sulfate (50mg  elemental zinc )  220 mg Oral Daily   Continuous Infusions:  sodium chloride  Stopped (08/30/24 1811)   ampicillin -sulbactam (UNASYN ) IV 3 g (08/31/24 1039)     LOS: 4 days    Time spent: 45 minutes spent on chart review, discussion with nursing staff, consultants, updating family and interview/physical exam; more than 50% of that time was spent in counseling and/or coordination of care.  This record has been created using Conservation officer, historic buildings. Errors have been sought and corrected,but may not always be located. Such creation errors do not reflect on the standard of care.   Derryl Duval, MD [facility] Available via Epic secure chat 7am-7pm After these hours, please refer to coverage provider listed on [email or web address] 08/31/2024, 2:06 PM

## 2024-09-01 DIAGNOSIS — M86171 Other acute osteomyelitis, right ankle and foot: Secondary | ICD-10-CM | POA: Diagnosis not present

## 2024-09-01 DIAGNOSIS — E785 Hyperlipidemia, unspecified: Secondary | ICD-10-CM | POA: Diagnosis not present

## 2024-09-01 DIAGNOSIS — Z8673 Personal history of transient ischemic attack (TIA), and cerebral infarction without residual deficits: Secondary | ICD-10-CM | POA: Diagnosis not present

## 2024-09-01 DIAGNOSIS — I10 Essential (primary) hypertension: Secondary | ICD-10-CM | POA: Diagnosis not present

## 2024-09-01 LAB — CULTURE, BLOOD (ROUTINE X 2)
Culture: NO GROWTH
Culture: NO GROWTH

## 2024-09-01 LAB — SURGICAL PATHOLOGY

## 2024-09-01 NOTE — Progress Notes (Signed)
 PROGRESS NOTE   James Gillespie  FMW:[id number] DOB: Apr 12, 1951 DOA: 08/27/2024 PCP: [name], MD   Chief Complaint  Patient presents with   Wound Check   Level of care: Telemetry  Brief Admission History:   73 y.o. 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 - Patient presents to ED per podiatry instructions Dr. [name], patient with known right foot infection, which has been worsening, neuropathic, does not feel pain, he has been having large amount of drainage from his foot, had prior debridement in the office with packing in place, he was instructed to come to ED by his podiatrist as a need of amputation, refusing until this point--- had debridement in the office, and he finished Augmentin  and doxycycline without much improvement, x-ray in the office showing osteomyelitis, so patient was directed to come to ED.   Assessment and Plan:  Right foot osteomyelitis - With known history of chronic right foot ulcer, sent by podiatry office given worsening wound and discharge  - X-ray significant for osteomyelitis   - Postop s/p first ray amputation by podiatry 11/21 - Blood cultures negative, intraoperative culture positive for Enterococcus faecalis, susceptibilities pending.  Surgical path pending - Continue with IV antibiotics (IV Unasyn ) - Patient is stable from a medical and surgical standpoint for discharge to SNF - NWB RLE - DC to SNF in AM on oral augmentin  - follow up with Dr. Sherrilee in 1 week post discharge   Right knee pain -x ray negative -lidocaine  patch -PT eval, supportive measures   Hyperlipidemia - Continue with atorvastatin    Neuropathic pain - Continue with gabapentin    Alcohol abuse -  CIWA protocol   Depression - Continue with Lexapro    BPH - Continue with tamsulosin    Hypertension - Continue with losartan    Hypokalemia - Resolved with repletion -Continue to  monitor   Permanent A-fib A-fib severe bradycardia -Status post single-chamber pacemaker 08/28/2022 - Resume Xarelto     History of CAD PVD CVA - stable, resumed home meds    DVT prophylaxis: rivaroxaban  Code Status:  Family Communication:  Disposition: SNF rehab    Consultants:   Procedures:   Antimicrobials:    Subjective: Pt reports that he is ready to participate in rehab. Pain controlled, no specific complaints.   Objective: Vitals:   08/31/24 0535 08/31/24 1344 08/31/24 2152 09/01/24 0448  BP: (!) 142/76 137/68 130/81 (!) 140/81  Pulse: 60 62 63 83  Resp: 18 20  17   Temp: 98 F (36.7 C) 98.3 F (36.8 C) 98.6 F (37 C) 98.1 F (36.7 C)  TempSrc: Oral Oral Oral Oral  SpO2: 94% 94% 93% 96%  Weight:      Height:        Intake/Output Summary (Last 24 hours) at 09/01/2024 1125 Last data filed at 09/01/2024 0730 Gross per 24 hour  Intake 440 ml  Output 1700 ml  Net -1260 ml   Filed Weights   08/27/24 1401 08/29/24 0701  Weight: 78.9 kg 78.9 kg   Examination:  General exam: Appears calm and comfortable  Respiratory system: Clear to auscultation. Respiratory effort normal. Cardiovascular system: normal S1 & S2 heard. No JVD, murmurs, rubs, gallops or clicks. No pedal edema. Gastrointestinal system: Abdomen is nondistended, soft and nontender. No organomegaly or masses felt. Normal bowel sounds heard. Central nervous system: Alert and oriented. No focal neurological deficits. Extremities: bandages clean, dry, intact.  Skin: No rashes, lesions or ulcers.  Psychiatry: Judgement and insight appear normal. Mood & affect appropriate.   Data Reviewed: I have personally reviewed following labs and imaging studies  CBC: Recent Labs  Lab 08/27/24 1652 08/28/24 0424 08/29/24 0359 08/31/24 0428  WBC 10.0 6.9 7.4 8.6  NEUTROABS 6.9  --   --  5.9  HGB 17.8* 16.0 13.8 13.4  HCT 53.4* 48.1 41.7 41.0  MCV 96.4 97.6 97.2 98.6  PLT 202 202 185 184    Basic  Metabolic Panel: Recent Labs  Lab 08/27/24 1652 08/28/24 0424 08/29/24 0359 08/31/24 0428  NA 140 142 139 136  K 3.2* 4.1 3.6 4.4  CL 101 105 104 103  CO2 25 27 26 24   GLUCOSE 100* 84 85 83  BUN 18 16 18  25*  CREATININE 1.18 1.16 1.14 1.17  CALCIUM  9.3 8.9 8.3* 8.5*    CBG: No results for input(s): GLUCAP in the last 168 hours.  Recent Results (from the past 240 hours)  Blood Cultures x 2 sites     Status: None   Collection Time: 08/27/24  5:30 PM   Specimen: BLOOD  Result Value Ref Range Status   Specimen Description BLOOD BLOOD RIGHT ARM  Final   Special Requests NONE  Final   Culture   Final    NO GROWTH 5 DAYS Performed at [facility], [address], [place], [place] [address]    Report Status 09/01/2024 FINAL  Final  Blood Cultures x 2 sites     Status: None   Collection Time: 08/27/24  5:37 PM   Specimen: BLOOD  Result Value Ref Range Status   Specimen Description BLOOD BLOOD LEFT ARM  Final   Special Requests NONE  Final   Culture   Final    NO GROWTH 5 DAYS Performed at [facility], [address] [place] [address], [place], [place] [address]    Report Status 09/01/2024 FINAL  Final  Aerobic/Anaerobic Culture w Gram Stain (surgical/deep wound)     Status: None (Preliminary result)   Collection Time: 08/29/24  8:13 AM   Specimen: Path fluid; Body Fluid  Result Value Ref Range Status   Specimen Description   Final    TISSUE BONE Performed at [facility], [address] [place], [place] [address]    Special Requests   Final    NONE Performed at [facility], [address], [place], [place] [address]    Gram Stain   Final    RARE WBC PRESENT, PREDOMINANTLY PMN NO ORGANISMS SEEN    Culture   Final    NO GROWTH 3 DAYS NO ANAEROBES ISOLATED; CULTURE IN PROGRESS FOR 5 DAYS Performed at [facility], [address], [place], [place] [address]    Report Status PENDING  Incomplete  Aerobic/Anaerobic Culture w Gram Stain (surgical/deep wound)     Status:  None (Preliminary result)   Collection Time: 08/29/24  8:17 AM   Specimen: Path fluid  Result Value Ref Range Status   Specimen Description FLUID  Final   Special Requests NONE  Final   Gram Stain   Final    NO WBC SEEN NO ORGANISMS SEEN Performed at [facility], [address] [place], [place] [address]    Culture   Final    FEW ENTEROCOCCUS FAECALIS NO ANAEROBES ISOLATED; CULTURE IN PROGRESS FOR 5 DAYS    Report Status PENDING  Incomplete   Organism ID, Bacteria ENTEROCOCCUS FAECALIS  Final      Susceptibility  Enterococcus faecalis - MIC*    AMPICILLIN  <=2 SENSITIVE Sensitive     VANCOMYCIN  1 SENSITIVE Sensitive     GENTAMICIN SYNERGY RESISTANT Resistant     * FEW ENTEROCOCCUS FAECALIS     Radiology Studies: No results found.  Scheduled Meds:  amLODipine   10 mg Oral Daily   ascorbic acid   500 mg Oral Daily   atorvastatin   40 mg Oral QHS   escitalopram   5 mg Oral Daily   feeding supplement  237 mL Oral BID WC   folic acid   1 mg Oral Daily   gabapentin   200 mg Oral BID   hydrALAZINE   25 mg Oral BID   lidocaine   1 patch Transdermal Q24H   linezolid   600 mg Oral Q12H   losartan   50 mg Oral Daily   multivitamin with minerals  1 tablet Oral Daily   nutrition supplement (JUVEN)  1 packet Oral BID BM   Rivaroxaban   15 mg Oral Q supper   tamsulosin   0.4 mg Oral QPC supper   thiamine   100 mg Oral Daily   Or   thiamine   100 mg Intravenous Daily   zinc  sulfate (50mg  elemental zinc )  220 mg Oral Daily   Continuous Infusions:  ampicillin -sulbactam (UNASYN ) IV 3 g (09/01/24 0507)    LOS: 5 days   Time spent: 56 mins  Trigg Delarocha Vicci, MD How to contact the [facility] Attending or Consulting provider 7A - 7P or covering provider during after hours 7P -7A, for this patient?  Check the care team in [facility] and look for a) attending/consulting [facility] provider listed and b) the [facility] team listed Log into [email or web address] to find provider on call.  Locate the [facility] provider you are  looking for under [facility] and page to a number that you can be directly reached. If you still have difficulty reaching the provider, please page the [facility] (Director on Call) for the Hospitalists listed on amion for assistance.  09/01/2024, 11:25 AM  ]

## 2024-09-01 NOTE — Progress Notes (Signed)
 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 He denies any pain at the surgical site. He denies any nausea, vomiting, fevers, or chills.   MEDICATIONS Scheduled Meds:  amLODipine   10 mg Oral Daily   ascorbic acid   500 mg Oral Daily   atorvastatin   40 mg Oral QHS   escitalopram   5 mg Oral Daily   feeding supplement  237 mL Oral BID WC   folic acid   1 mg Oral Daily   gabapentin   200 mg Oral BID   hydrALAZINE   25 mg Oral BID   lidocaine   1 patch Transdermal Q24H   linezolid   600 mg Oral Q12H   losartan   50 mg Oral Daily   multivitamin with minerals  1 tablet Oral Daily   nutrition supplement (JUVEN)  1 packet Oral BID BM   Rivaroxaban   15 mg Oral Q supper   tamsulosin   0.4 mg Oral QPC supper   thiamine   100 mg Oral Daily   Or   thiamine   100 mg Intravenous Daily   zinc  sulfate (50mg  elemental zinc )  220 mg Oral Daily   Continuous Infusions:  ampicillin -sulbactam (UNASYN ) IV 3 g (09/01/24 1727)   PRN Meds:.acetaminophen  **OR** acetaminophen , albuterol , hydrALAZINE , hydrogen peroxide , morphine  injection, ondansetron  **OR** ondansetron  (ZOFRAN ) IV, oxyCODONE   OBJECTIVE Vital signs in last 24 hours:   Temp:  [97.9 F (36.6 C)-98.2 F (36.8 C)] 97.9 F (36.6 C) (11/24 1954) Pulse Rate:  [60-83] 60 (11/24 1954) Resp:  [16-18] 18 (11/24 1954) BP: (122-140)/(62-81) 125/68 (11/24 1954) SpO2:  [94 %-96 %] 95 % (11/24 1954)  General: Alert and oriented, in no acute distress. Dressing: Surgical dressing in place with mild amount of blood strikethrough. Integument: Surgical site with sutures intact. All skin flaps appear viable. No erythema. Musculoskeletal: Calves soft, NT.  LAB/TEST RESULTS  Recent Labs    08/31/24 0428  WBC 8.6  HGB 13.4  HCT 41.0  PLT 184  NA 136  K 4.4  CL 103  CO2 24  BUN 25*  CREATININE 1.17  GLUCOSE 83  CALCIUM  8.5*    Recent Results  (from the past 240 hours)  Blood Cultures x 2 sites     Status: None   Collection Time: 08/27/24  5:30 PM   Specimen: BLOOD  Result Value Ref Range Status   Specimen Description BLOOD BLOOD RIGHT ARM  Final   Special Requests NONE  Final   Culture   Final    NO GROWTH 5 DAYS Performed at [facility], [address] [place], [place] [address]    Report Status 09/01/2024 FINAL  Final  Blood Cultures x 2 sites     Status: None   Collection Time: 08/27/24  5:37 PM   Specimen: BLOOD  Result Value Ref Range Status   Specimen Description BLOOD BLOOD LEFT ARM  Final   Special Requests NONE  Final   Culture   Final    NO GROWTH 5 DAYS Performed at [facility], [address] [place], [place] [address]    Report Status 09/01/2024 FINAL  Final  Aerobic/Anaerobic Culture w Gram Stain (surgical/deep wound)     Status: None (Preliminary result)   Collection Time: 08/29/24  8:13 AM   Specimen: Path fluid; Body Fluid  Result Value Ref Range Status   Specimen Description   Final    TISSUE BONE Performed at [facility],  [address], [place], [place] [address]    Special Requests   Final    NONE Performed at [facility], [address], [place], [place] [address]    Gram Stain   Final    RARE WBC PRESENT, PREDOMINANTLY PMN NO ORGANISMS SEEN    Culture   Final    NO GROWTH 3 DAYS NO ANAEROBES ISOLATED; CULTURE IN PROGRESS FOR 5 DAYS Performed at [facility], [address] [place], [place] [address]    Report Status PENDING  Incomplete  Aerobic/Anaerobic Culture w Gram Stain (surgical/deep wound)     Status: None (Preliminary result)   Collection Time: 08/29/24  8:17 AM   Specimen: Path fluid  Result Value Ref Range Status   Specimen Description FLUID  Final   Special Requests NONE  Final   Gram Stain   Final    NO WBC SEEN NO ORGANISMS SEEN Performed at [facility], [address], [place], [place] [address]    Culture   Final    FEW ENTEROCOCCUS  FAECALIS NO ANAEROBES ISOLATED; CULTURE IN PROGRESS FOR 5 DAYS    Report Status PENDING  Incomplete   Organism ID, Bacteria ENTEROCOCCUS FAECALIS  Final      Susceptibility   Enterococcus faecalis - MIC*    AMPICILLIN  <=2 SENSITIVE Sensitive     VANCOMYCIN  1 SENSITIVE Sensitive     GENTAMICIN SYNERGY RESISTANT Resistant     * FEW ENTEROCOCCUS FAECALIS     No results found.   ASSESSMENT & PLAN This is a 73 year old male POD 3 S/P partial 1st ray amputation of the right foot, currently with expected post-operative course and no signs of complications.  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 Dressing changed. Awaiting transfer to SNF. Patient to follow-up with me at my [place] office on 09/09/2024 at 3:15 PM.  Morene LILLETTE Anon 09/01/2024, 10:01 PM

## 2024-09-01 NOTE — Hospital Course (Signed)
 73 y.o. 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 - Patient presents to ED per podiatry instructions Dr. [name], patient with known right foot infection, which has been worsening, neuropathic, does not feel pain, he has been having large amount of drainage from his foot, had prior debridement in the office with packing in place, he was instructed to come to ED by his podiatrist as a need of amputation, refusing until this point--- had debridement in the office, and he finished Augmentin  and doxycycline without much improvement, x-ray in the office showing osteomyelitis, so patient was directed to come to ED.

## 2024-09-01 NOTE — Plan of Care (Signed)

## 2024-09-01 NOTE — TOC Progression Note (Signed)
 Transition of Care [facility]) - Progression Note    Patient Details  Name: James Gillespie MRN: [id number] Date of Birth: 07/25/1951  [facility] CM/SW Contact  [place] Saddie Kim, [place] Phone Number: 09/01/2024, 9:38 AM  Clinical Narrative:  Pt medically ready per MD. [facility] can accept pt tomorrow as they request it be 7 days since last alcohol consumption. MD notified.      Expected Discharge Plan: Skilled Nursing Facility Barriers to Discharge: Continued Medical Work up               Expected Discharge Plan and Services In-house Referral: Clinical Social Work Discharge Planning Services: NA Post Acute Care Choice: Skilled Nursing Facility Living arrangements for the past 2 months: Single Family Home                 DME Arranged: N/A DME Agency: NA                   Social Drivers of Health (SDOH) Interventions SDOH Screenings   Food Insecurity: No Food Insecurity (02/14/2024)   Received from [facility]  Housing: Low Risk  (02/14/2024)   Received from [facility]  Transportation Needs: No Transportation Needs (04/11/2024)   Received from [facility]  Utilities: Not At Risk (02/14/2024)   Received from [facility] Resource Strain: Low Risk  (02/14/2024)   Received from [facility]  Physical Activity: Inactive (02/14/2024)   Received from [facility]  Social Connections: Moderately Isolated (02/14/2024)   Received from [facility]  Stress: No Stress Concern Present (02/14/2024)   [facility] from [facility]  Tobacco Use: Low Risk  (08/29/2024)  Health Literacy: Inadequate Health Literacy (04/11/2024)   Received from [facility]    Readmission Risk Interventions    08/28/2024   11:08 AM  Readmission Risk Prevention Plan  Post Dischage Appt Complete  Medication Screening Complete  Transportation Screening Complete

## 2024-09-02 DIAGNOSIS — M86171 Other acute osteomyelitis, right ankle and foot: Secondary | ICD-10-CM | POA: Diagnosis not present

## 2024-09-02 DIAGNOSIS — I10 Essential (primary) hypertension: Secondary | ICD-10-CM | POA: Diagnosis not present

## 2024-09-02 DIAGNOSIS — Z8673 Personal history of transient ischemic attack (TIA), and cerebral infarction without residual deficits: Secondary | ICD-10-CM | POA: Diagnosis not present

## 2024-09-02 DIAGNOSIS — E785 Hyperlipidemia, unspecified: Secondary | ICD-10-CM | POA: Diagnosis not present

## 2024-09-02 MED ORDER — FOLIC ACID 1 MG PO TABS: 1.0000 mg | ORAL_TABLET | Freq: Every day | ORAL | Status: AC

## 2024-09-02 MED ORDER — ACETAMINOPHEN 325 MG PO TABS: 650.0000 mg | ORAL_TABLET | Freq: Four times a day (QID) | ORAL | Status: AC | PRN

## 2024-09-02 MED ORDER — AMOXICILLIN-POT CLAVULANATE 875-125 MG PO TABS: 1.0000 | ORAL_TABLET | Freq: Two times a day (BID) | ORAL | Status: AC

## 2024-09-02 MED ORDER — ALBUTEROL SULFATE (2.5 MG/3ML) 0.083% IN NEBU: 2.5000 mg | INHALATION_SOLUTION | RESPIRATORY_TRACT | Status: AC | PRN

## 2024-09-02 MED ORDER — PROBIOTIC PO CAPS: 1.0000 | ORAL_CAPSULE | Freq: Three times a day (TID) | ORAL | Status: AC

## 2024-09-02 MED ORDER — LIDOCAINE 5 % EX PTCH: 1.0000 | MEDICATED_PATCH | CUTANEOUS | Status: AC

## 2024-09-02 MED ORDER — HYDROGEN PEROXIDE 3 % EX SOLN: Freq: Every day | CUTANEOUS | Status: AC | PRN

## 2024-09-02 MED ORDER — JUVEN PO PACK: 1.0000 | PACK | Freq: Two times a day (BID) | ORAL | Status: AC

## 2024-09-02 MED ORDER — ADULT MULTIVITAMIN W/MINERALS CH: 1.0000 | ORAL_TABLET | Freq: Every day | ORAL | Status: AC

## 2024-09-02 MED ORDER — ENSURE PLUS HIGH PROTEIN PO LIQD: 237.0000 mL | Freq: Two times a day (BID) | ORAL | Status: AC

## 2024-09-02 MED ORDER — ZINC SULFATE 220 (50 ZN) MG PO CAPS: 220.0000 mg | ORAL_CAPSULE | Freq: Every day | ORAL | Status: AC

## 2024-09-02 MED ORDER — OXYCODONE HCL 5 MG PO TABS: 5.0000 mg | ORAL_TABLET | Freq: Four times a day (QID) | ORAL | 0 refills | Status: AC | PRN

## 2024-09-02 MED ORDER — VITAMIN B-1 100 MG PO TABS: 100.0000 mg | ORAL_TABLET | Freq: Every day | ORAL | Status: AC

## 2024-09-02 MED ORDER — ASCORBIC ACID 500 MG PO TABS: 500.0000 mg | ORAL_TABLET | Freq: Every day | ORAL | Status: AC

## 2024-09-02 NOTE — Discharge Summary (Signed)
 Physician Discharge Summary  James Gillespie FMW:[id number] DOB: 1951/09/07 DOA: 08/27/2024  PCP: [name], MD DPM: [name]  Admit date: 08/27/2024 Discharge date: 09/02/2024  Disposition:  SNF   Recommendations for Outpatient Follow-up:  Follow up with Dr [name] in 1 week as scheduled Follow up with PCP in 2 weeks    Home Health:  SNF  Discharge Condition: STABLE   CODE STATUS: FULL DIET: Heart healthy   Brief Hospitalization Summary: Please see all hospital notes, images, labs for full details of the hospitalization. Admission provider HPI:   73 y.o. 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 - Patient presents to ED per podiatry instructions Dr. [name], patient with known right foot infection, which has been worsening, neuropathic, does not feel pain, he has been having large amount of drainage from his foot, had prior debridement in the office with packing in place, he was instructed to come to ED by his podiatrist as a need of amputation, refusing until this point--- had debridement in the office, and he finished Augmentin  and doxycycline without much improvement, x-ray in the office showing osteomyelitis, so patient was directed to come to ED.    Hospital Course by listed problems  Right foot osteomyelitis - managed surgically - With known history of chronic right foot ulcer, sent by podiatry office given worsening wound and discharge  - X-ray significant for osteomyelitis   - Postop s/p first ray amputation by podiatry 11/21 - Blood cultures negative, intraoperative culture positive for Enterococcus faecalis.  Surgical path pending - pt was treated with with IV antibiotics (IV Unasyn  and oral linezolid ) while inpatient - Patient is stable from a medical and surgical standpoint for discharge to SNF - NWB RLE - DC to SNF on oral augmentin  - follow up with Dr. Sherrilee in 1 week post  discharge   Right knee pain -x ray negative -lidocaine  patch -PT eval, supportive measures -SNF  -if persists may need outpatient orthopedics eval   Hyperlipidemia - Continue with atorvastatin    Neuropathic pain - Continue with gabapentin    Alcohol abuse - CIWA protocol  - he is now 7 days since last consumption   Depression - Continue with Lexapro    BPH - Continue with tamsulosin    Hypertension - Continue with losartan    Hypokalemia - Resolved with repletion -Continue to monitor   Permanent A-fib A-fib severe bradycardia -Status post single-chamber pacemaker 08/28/2022 - Resumed Xarelto     History of CAD PVD CVA - stable, resumed home meds     Discharge Diagnoses:  Principal Problem:   Acute osteomyelitis of right foot (HCC) Active Problems:   History of CVA (cerebrovascular accident)   PAD (peripheral artery disease)   Hyperlipidemia   Essential hypertension   Discharge Instructions:  Allergies as of 09/02/2024   No Known Allergies      Medication List     STOP taking these medications    DEEP BLUE RELIEF EX   doxycycline 100 MG capsule Commonly known as: VIBRAMYCIN       TAKE these medications    acetaminophen  325 MG tablet Commonly known as: TYLENOL  Take 2 tablets (650 mg total) by mouth every 6 (six) hours as needed for mild pain (pain score 1-3), fever or headache (or Fever >/= 101).   albuterol  (2.5 MG/3ML) 0.083% nebulizer solution Commonly known as: PROVENTIL  Take 3 mLs (2.5 mg total) by nebulization every 4 (four) hours as needed for wheezing or  shortness of breath.   amLODipine  10 MG tablet Commonly known as: NORVASC  Take 10 mg by mouth daily.   amoxicillin -clavulanate 875-125 MG tablet Commonly known as: AUGMENTIN  Take 1 tablet by mouth 2 (two) times daily for 3 days.   ascorbic acid  500 MG tablet Commonly known as: VITAMIN C  Take 1 tablet (500 mg total) by mouth daily.   atorvastatin  80 MG tablet Commonly  known as: LIPITOR Take 40 mg by mouth at bedtime.   BENGAY EX Apply 1 application topically daily as needed (pain).   [employer] VITAMIN B12 1000 MCG tablet Generic drug: cyanocobalamin Take 2,000 mcg by mouth daily.   escitalopram  5 MG tablet Commonly known as: LEXAPRO  Take 5 mg by mouth daily.   feeding supplement Liqd Take 237 mLs by mouth 2 (two) times daily with a meal.   nutrition supplement (JUVEN) Pack Take 1 packet by mouth 2 (two) times daily between meals.   folic acid  1 MG tablet Commonly known as: FOLVITE  Take 1 tablet (1 mg total) by mouth daily.   furosemide 20 MG tablet Commonly known as: LASIX Take 20 mg by mouth daily.   gabapentin  100 MG capsule Commonly known as: NEURONTIN  Take 200 mg by mouth 2 (two) times daily.   hydrALAZINE  25 MG tablet Commonly known as: APRESOLINE  Take 25 mg by mouth in the morning and at bedtime.   hydrogen peroxide  3 % external solution Apply topically daily as needed (wound dressing change).   lidocaine  5 % Commonly known as: LIDODERM  Place 1 patch onto the skin daily. Remove & Discard patch within 12 hours or as directed by MD Apply to right knee Apply to intact skin to cover the most painful area. Apply the prescribed number of patches (maximum of 3), for up to 12 hours within a 24 hour period.   losartan  50 MG tablet Commonly known as: COZAAR  Take 50 mg by mouth daily.   multivitamin with minerals Tabs tablet Take 1 tablet by mouth daily.   oxyCODONE  5 MG immediate release tablet Commonly known as: Oxy IR/ROXICODONE  Take 1 tablet (5 mg total) by mouth every 6 (six) hours as needed for moderate pain (pain score 4-6), severe pain (pain score 7-10) or breakthrough pain.   Probiotic Caps Take 1 capsule by mouth 3 (three) times daily. What changed: when to take this   tamsulosin  0.4 MG Caps capsule Commonly known as: FLOMAX  Take 0.4 mg by mouth daily after supper.   thiamine  100 MG tablet Commonly known as: Vitamin  B-1 Take 1 tablet (100 mg total) by mouth daily.   Xarelto  15 MG Tabs tablet Generic drug: Rivaroxaban  Take 15 mg by mouth daily.   zinc  sulfate (50mg  elemental zinc ) 220 (50 Zn) MG capsule Take 1 capsule (220 mg total) by mouth daily.        Contact information for follow-up providers     [name] Katz, DPM Follow up on 09/09/2024.   Specialty: Podiatry Why: 3:15 PM Contact information: [address] [place] [address] [phone]              Contact information for after-discharge care     Destination     [facility] .   Service: Skilled Nursing Contact information: [address] [facility]  [address] [phone]                    No Known Allergies Allergies as of 09/02/2024   No Known Allergies  Medication List     STOP taking these medications    DEEP BLUE RELIEF EX   doxycycline 100 MG capsule Commonly known as: VIBRAMYCIN       TAKE these medications    acetaminophen  325 MG tablet Commonly known as: TYLENOL  Take 2 tablets (650 mg total) by mouth every 6 (six) hours as needed for mild pain (pain score 1-3), fever or headache (or Fever >/= 101).   albuterol  (2.5 MG/3ML) 0.083% nebulizer solution Commonly known as: PROVENTIL  Take 3 mLs (2.5 mg total) by nebulization every 4 (four) hours as needed for wheezing or shortness of breath.   amLODipine  10 MG tablet Commonly known as: NORVASC  Take 10 mg by mouth daily.   amoxicillin -clavulanate 875-125 MG tablet Commonly known as: AUGMENTIN  Take 1 tablet by mouth 2 (two) times daily for 3 days.   ascorbic acid  500 MG tablet Commonly known as: VITAMIN C  Take 1 tablet (500 mg total) by mouth daily.   atorvastatin  80 MG tablet Commonly known as: LIPITOR Take 40 mg by mouth at bedtime.   BENGAY EX Apply 1 application topically daily as needed (pain).   CVS VITAMIN B12 1000 MCG tablet Generic drug: cyanocobalamin Take 2,000 mcg by mouth  daily.   escitalopram  5 MG tablet Commonly known as: LEXAPRO  Take 5 mg by mouth daily.   feeding supplement Liqd Take 237 mLs by mouth 2 (two) times daily with a meal.   nutrition supplement (JUVEN) Pack Take 1 packet by mouth 2 (two) times daily between meals.   folic acid  1 MG tablet Commonly known as: FOLVITE  Take 1 tablet (1 mg total) by mouth daily.   furosemide 20 MG tablet Commonly known as: LASIX Take 20 mg by mouth daily.   gabapentin  100 MG capsule Commonly known as: NEURONTIN  Take 200 mg by mouth 2 (two) times daily.   hydrALAZINE  25 MG tablet Commonly known as: APRESOLINE  Take 25 mg by mouth in the morning and at bedtime.   hydrogen peroxide  3 % external solution Apply topically daily as needed (wound dressing change).   lidocaine  5 % Commonly known as: LIDODERM  Place 1 patch onto the skin daily. Remove & Discard patch within 12 hours or as directed by MD Apply to right knee Apply to intact skin to cover the most painful area. Apply the prescribed number of patches (maximum of 3), for up to 12 hours within a 24 hour period.   losartan  50 MG tablet Commonly known as: COZAAR  Take 50 mg by mouth daily.   multivitamin with minerals Tabs tablet Take 1 tablet by mouth daily.   oxyCODONE  5 MG immediate release tablet Commonly known as: Oxy IR/ROXICODONE  Take 1 tablet (5 mg total) by mouth every 6 (six) hours as needed for moderate pain (pain score 4-6), severe pain (pain score 7-10) or breakthrough pain.   Probiotic Caps Take 1 capsule by mouth 3 (three) times daily. What changed: when to take this   tamsulosin  0.4 MG Caps capsule Commonly known as: FLOMAX  Take 0.4 mg by mouth daily after supper.   thiamine  100 MG tablet Commonly known as: Vitamin B-1 Take 1 tablet (100 mg total) by mouth daily.   Xarelto  15 MG Tabs tablet Generic drug: Rivaroxaban  Take 15 mg by mouth daily.   zinc  sulfate (50mg  elemental zinc ) 220 (50 Zn) MG capsule Take 1 capsule  (220 mg total) by mouth daily.        Procedures/Studies: DG Foot Complete Right Result Date: 08/29/2024 EXAM: 3 OR MORE  VIEW(S) XRAY OF THE FOOT 08/29/2024 10:04:18 AM COMPARISON: None available. CLINICAL HISTORY: 230731 Postoperative state 230731 FINDINGS: BONES AND JOINTS: Interval transmetatarsal amputation of the 1st digit. Prior 2nd toe amputation. Remote deformities and shortening of the 4th and 5th metatarsals distally. Remote deformity in the 3rd metatarsal distally with a screw in the head of the 3rd metatarsal. Hammertoe deformities in the remaining toes. Plantar calcaneal spur. No acute fracture. No joint dislocation. SOFT TISSUES: Gas noted in the soft tissues along the resection bed of the 1st digit. Vascular calcifications noted. IMPRESSION: 1. Interval transmetatarsal amputation of the 1st digit with gas in the soft tissues along the resection bed. 2. Prior 2nd toe amputation. 3. Remote deformities and shortening of the 4th and 5th metatarsals distally. 4. Remote deformity in the 3rd metatarsal distally with a screw in the head of the 3rd metatarsal. 5. Hammertoe deformities in the remaining toes. 6. Plantar calcaneal spur. 7. Vascular calcifications. Electronically signed by: Ryan Salvage MD 08/29/2024 11:47 AM EST RP Workstation: [id number]   DG Knee Complete 4 Views Right Result Date: 08/27/2024 CLINICAL DATA:  Infection EXAM: RIGHT KNEE - COMPLETE 4+ VIEW COMPARISON:  None Available. FINDINGS: No evidence of fracture, dislocation, or joint effusion. 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 Soft tissues are unremarkable. IMPRESSION: 1. No acute fracture or dislocation. 2. Medial and lateral compartment chondrocalcinosis. Electronically Signed   By: Greig Pique M.D.   On: 08/27/2024 18:17   DG Foot 2 Views Right Result Date: 08/27/2024 EXAM: 1 or 2 VIEW(S) XRAY OF THE FOOT 08/27/2024 05:34:53 PM COMPARISON: None available. CLINICAL HISTORY: infection  FINDINGS: BONES AND JOINTS: Screw within third Metatarsal head noted. Second Digit amputation at level of MTP joint. Chronic deformity of third through fifth Digits. Query erosion along plantar aspect of first Digit Proximal Phalanx base with concern for osteomyelitis. Degenerative changes of first Digit Metatarsophalangeal joint. SOFT TISSUES: Subcutaneous soft tissue edema of forefoot. Vascular calcifications. Ulceration over the ball of the foot. IMPRESSION: 1. Ulceration over the ball of the foot with concern for osteomyelitis at the base of the proximal phalanx of the first digit. 2. Second digit amputation at the level of the MTP joint. 3. Screw within the third metatarsal head. Electronically signed by: Norleen Boxer MD 08/27/2024 06:11 PM EST RP Workstation: [id number]   CT FOOT RIGHT WO CONTRAST Result Date: 08/07/2024 CLINICAL DATA:  Osteomyelitis. History of right foot wound debridement and amputations in 2021. EXAM: CT OF THE RIGHT FOOT WITHOUT CONTRAST TECHNIQUE: Multidetector CT imaging of the right foot was performed according to the standard protocol. Multiplanar CT image reconstructions were also generated. RADIATION DOSE REDUCTION: This exam was performed according to the departmental dose-optimization program which includes automated exposure control, adjustment of the mA and/or kV according to patient size and/or use of iterative reconstruction technique. COMPARISON:  None recent. Right foot radiographs 05/12/2020 and 02/12/2020. FINDINGS: Technical note: Despite efforts by the technologist and patient, mild motion artifact is present on today's exam and could not be eliminated. This reduces exam sensitivity and specificity. Bones/Joint/Cartilage The most recent available prior radiographs demonstrate postsurgical changes from previous amputation of the 2nd toe at the metatarsophalangeal joint and of the distal 5th metatarsal. Patient has undergone interval amputation through the distal 4th  metatarsal. The 4th and 5th toes remain. There are stable postsurgical changes within the 3rd metatarsal head with new lateral subluxation at the 3rd metatarsophalangeal joint. There are some erosive changes involving the 2nd metatarsal head which are not definitely  new from the most recent prior radiographs. Grossly stable posttraumatic deformities of the 3rd metatarsal and the 1st distal phalanx. No evidence of osteomyelitis or acute osseous abnormality within the midfoot or hindfoot. No large joint effusions are identified. Ligaments Suboptimally assessed by CT. Muscles and Tendons Generalized muscular atrophy. Soft tissues Linear metallic foreign body distally in the great toe appears chronic, unchanged from prior radiographs. There is gas within the soft tissues distal to the remaining 2nd metatarsal. Surrounding irregular soft tissue swelling extending along the plantar aspects of the 1st metatarsophalangeal joint and 2nd metatarsal head. There is ill-defined increased density within the subcutaneous fat along the plantar aspect of the calcaneal tuberosity, measuring up to 3.0 cm on image 213/8. No evidence of organized fluid collection or other foreign body on noncontrast imaging. IMPRESSION: 1. Postsurgical changes as described, including interval amputation through the distal 4th metatarsal. 2. Soft tissue swelling and gas within the soft tissues of the great toe and distal to the remaining 2nd metatarsal, suspicious for soft tissue infection. Erosive changes involving the 2nd metatarsal head are not definitely new from the most recent prior radiographs and could be chronic. Osteomyelitis cannot be excluded. Recommend plain film correlation to allow better comparison with prior radiographs. 3. No evidence of osteomyelitis or acute osseous abnormality within the midfoot or hindfoot. 4. Ill-defined increased density within the subcutaneous fat along the plantar aspect of the calcaneal tuberosity, possibly a  small hematoma or phlegmon. No evidence of organized fluid collection or other foreign body on noncontrast imaging. 5. Chronic posttraumatic deformities of the 3rd metatarsal and 1st distal phalanx. 6. Chronic linear metallic foreign body distally in the great toe. Electronically Signed   By: Elsie Perone M.D.   On: 08/07/2024 16:44     Subjective: Pt having some right leg pain on occasion but otherwise has been well and agreeable to SNF rehab  Discharge Exam: Vitals:   09/02/24 0429 09/02/24 0747  BP: 133/84 (!) 147/76  Pulse: 72 62  Resp: 18 18  Temp: 98.2 F (36.8 C) 98.5 F (36.9 C)  SpO2: 94% 95%   Vitals:   09/01/24 1340 09/01/24 1954 09/02/24 0429 09/02/24 0747  BP: 122/62 125/68 133/84 (!) 147/76  Pulse: 62 60 72 62  Resp: 16 18 18 18   Temp: 98.2 F (36.8 C) 97.9 F (36.6 C) 98.2 F (36.8 C) 98.5 F (36.9 C)  TempSrc: Oral   Oral  SpO2: 94% 95% 94% 95%  Weight:      Height:        General: Pt is alert, awake, not in acute distress Cardiovascular: RRR, S1/S2 +, no rubs, no gallops Respiratory: CTA bilaterally, no wheezing, no rhonchi Abdominal: Soft, NT, ND, bowel sounds + Extremities: bandages clean and dry and intact   The results of significant diagnostics from this hospitalization (including imaging, microbiology, ancillary and laboratory) are listed below for reference.     Microbiology: Recent Results (from the past 240 hours)  Blood Cultures x 2 sites     Status: None   Collection Time: 08/27/24  5:30 PM   Specimen: BLOOD  Result Value Ref Range Status   Specimen Description BLOOD BLOOD RIGHT ARM  Final   Special Requests NONE  Final   Culture   Final    NO GROWTH 5 DAYS Performed at [facility], [address] [place], [place] [address]    Report Status 09/01/2024 FINAL  Final  Blood Cultures x 2 sites     Status: None  Collection Time: 08/27/24  5:37 PM   Specimen: BLOOD  Result Value Ref Range Status   Specimen Description BLOOD BLOOD  LEFT ARM  Final   Special Requests NONE  Final   Culture   Final    NO GROWTH 5 DAYS Performed at [facility], [address] [place], [place] [address]    Report Status 09/01/2024 FINAL  Final  Aerobic/Anaerobic Culture w Gram Stain (surgical/deep wound)     Status: None (Preliminary result)   Collection Time: 08/29/24  8:13 AM   Specimen: Path fluid; Body Fluid  Result Value Ref Range Status   Specimen Description   Final    TISSUE BONE Performed at [facility], [address] [place], [place] [address]    Special Requests   Final    NONE Performed at [facility], [address] [place], [place] [address]    Gram Stain   Final    RARE WBC PRESENT, PREDOMINANTLY PMN NO ORGANISMS SEEN    Culture   Final    NO GROWTH 4 DAYS NO ANAEROBES ISOLATED; CULTURE IN PROGRESS FOR 5 DAYS Performed at [facility], [address], [place], [place] [address]    Report Status PENDING  Incomplete  Aerobic/Anaerobic Culture w Gram Stain (surgical/deep wound)     Status: None (Preliminary result)   Collection Time: 08/29/24  8:17 AM   Specimen: Path fluid  Result Value Ref Range Status   Specimen Description FLUID  Final   Special Requests NONE  Final   Gram Stain   Final    NO WBC SEEN NO ORGANISMS SEEN Performed at [facility], [address], [place], [place] [address]    Culture   Final    FEW ENTEROCOCCUS FAECALIS NO ANAEROBES ISOLATED; CULTURE IN PROGRESS FOR 5 DAYS    Report Status PENDING  Incomplete   Organism ID, Bacteria ENTEROCOCCUS FAECALIS  Final      Susceptibility   Enterococcus faecalis - MIC*    AMPICILLIN  <=2 SENSITIVE Sensitive     VANCOMYCIN  1 SENSITIVE Sensitive     GENTAMICIN SYNERGY RESISTANT Resistant     * FEW ENTEROCOCCUS FAECALIS     Labs: BNP (last 3 results) No results for input(s): BNP in the last 8760 hours. Basic Metabolic Panel: Recent Labs  Lab 08/27/24 1652 08/28/24 0424 08/29/24 0359 08/31/24 0428  NA 140 142  139 136  K 3.2* 4.1 3.6 4.4  CL 101 105 104 103  CO2 25 27 26 24   GLUCOSE 100* 84 85 83  BUN 18 16 18  25*  CREATININE 1.18 1.16 1.14 1.17  CALCIUM  9.3 8.9 8.3* 8.5*   Liver Function Tests: No results for input(s): AST, ALT, ALKPHOS, BILITOT, PROT, ALBUMIN in the last 168 hours. No results for input(s): LIPASE, AMYLASE in the last 168 hours. No results for input(s): AMMONIA in the last 168 hours. CBC: Recent Labs  Lab 08/27/24 1652 08/28/24 0424 08/29/24 0359 08/31/24 0428  WBC 10.0 6.9 7.4 8.6  NEUTROABS 6.9  --   --  5.9  HGB 17.8* 16.0 13.8 13.4  HCT 53.4* 48.1 41.7 41.0  MCV 96.4 97.6 97.2 98.6  PLT 202 202 185 184   Cardiac Enzymes: No results for input(s): CKTOTAL, CKMB, CKMBINDEX, TROPONINI in the last 168 hours. BNP: Invalid input(s): POCBNP CBG: No results for input(s): GLUCAP in the last 168 hours. D-Dimer No results for input(s): DDIMER in the last 72 hours. Hgb A1c No results for  input(s): HGBA1C in the last 72 hours. Lipid Profile No results for input(s): CHOL, HDL, LDLCALC, TRIG, CHOLHDL, LDLDIRECT in the last 72 hours. Thyroid function studies No results for input(s): TSH, T4TOTAL, T3FREE, THYROIDAB in the last 72 hours.  Invalid input(s): FREET3 Anemia work up No results for input(s): VITAMINB12, FOLATE, FERRITIN, TIBC, IRON, RETICCTPCT in the last 72 hours. Urinalysis No results found for: COLORURINE, APPEARANCEUR, LABSPEC, PHURINE, GLUCOSEU, HGBUR, BILIRUBINUR, KETONESUR, PROTEINUR, UROBILINOGEN, NITRITE, LEUKOCYTESUR Sepsis Labs Recent Labs  Lab 08/27/24 1652 08/28/24 0424 08/29/24 0359 08/31/24 0428  WBC 10.0 6.9 7.4 8.6   Microbiology Recent Results (from the past 240 hours)  Blood Cultures x 2 sites     Status: None   Collection Time: 08/27/24  5:30 PM   Specimen: BLOOD  Result Value Ref Range Status   Specimen Description BLOOD BLOOD RIGHT ARM   Final   Special Requests NONE  Final   Culture   Final    NO GROWTH 5 DAYS Performed at [facility], [address], [place], [place] [address]    Report Status 09/01/2024 FINAL  Final  Blood Cultures x 2 sites     Status: None   Collection Time: 08/27/24  5:37 PM   Specimen: BLOOD  Result Value Ref Range Status   Specimen Description BLOOD BLOOD LEFT ARM  Final   Special Requests NONE  Final   Culture   Final    NO GROWTH 5 DAYS Performed at [facility], [address], [place], [place] [address]    Report Status 09/01/2024 FINAL  Final  Aerobic/Anaerobic Culture w Gram Stain (surgical/deep wound)     Status: None (Preliminary result)   Collection Time: 08/29/24  8:13 AM   Specimen: Path fluid; Body Fluid  Result Value Ref Range Status   Specimen Description   Final    TISSUE BONE Performed at [facility], [address], [place], [place] [address]    Special Requests   Final    NONE Performed at [facility], [address] [place], [place] [address]    Gram Stain   Final    RARE WBC PRESENT, PREDOMINANTLY PMN NO ORGANISMS SEEN    Culture   Final    NO GROWTH 4 DAYS NO ANAEROBES ISOLATED; CULTURE IN PROGRESS FOR 5 DAYS Performed at [facility], [address] [place], [place] [address]    Report Status PENDING  Incomplete  Aerobic/Anaerobic Culture w Gram Stain (surgical/deep wound)     Status: None (Preliminary result)   Collection Time: 08/29/24  8:17 AM   Specimen: Path fluid  Result Value Ref Range Status   Specimen Description FLUID  Final   Special Requests NONE  Final   Gram Stain   Final    NO WBC SEEN NO ORGANISMS SEEN Performed at [facility], [address] [place], [place] [address]    Culture   Final    FEW ENTEROCOCCUS FAECALIS NO ANAEROBES ISOLATED; CULTURE IN PROGRESS FOR 5 DAYS    Report Status PENDING  Incomplete   Organism ID, Bacteria ENTEROCOCCUS FAECALIS  Final      Susceptibility   Enterococcus faecalis - MIC*     AMPICILLIN  <=2 SENSITIVE Sensitive     VANCOMYCIN  1 SENSITIVE Sensitive     GENTAMICIN SYNERGY RESISTANT Resistant     * FEW ENTEROCOCCUS FAECALIS   Time coordinating discharge: 40 mins  SIGNED:  Afton Louder, MD  [facility] 09/02/2024, 10:12 AM How to  contact the [facility] Attending or Consulting provider 7A - 7P or covering provider during after hours 7P -7A, for this patient?  Check the care team in [facility] and look for a) attending/consulting [facility] provider listed and b) the [facility] team listed Log into [email or web address] and use [facility] universal password to access. If you do not have the password, please contact the hospital operator. Locate the [facility] provider you are looking for under [facility] and page to a number that you can be directly reached. If you still have difficulty reaching the provider, please page the [facility] (Director on Call) for the Hospitalists listed on amion for assistance.

## 2024-09-02 NOTE — TOC Transition Note (Signed)
 Transition of Care [facility]) - Discharge Note   Patient Details  Name: James Gillespie MRN: [id number] Date of Birth: Aug 19, 1951  Transition of Care [facility]) CM/SW Contact:  Mcarthur Saddie Kim, LCSW Phone Number: 09/02/2024, 10:30 AM   Clinical Narrative: Pt d/c to [facility] today. Pt and facility aware and agreeable. Pt states he will notify his family. D/C summary sent to SNF. RN given number to call report. Pt will transport by [facility] waiver signed and on chart.       Final next level of care: Skilled Nursing Facility Barriers to Discharge: Barriers Resolved   Patient Goals and CMS Choice Patient states their goals for this hospitalization and ongoing recovery are:: To get better CMS Medicare.gov Compare Post Acute Care list provided to:: Patient Choice offered to / list presented to : Patient [place] ownership interest in [facility].provided to:: Patient    Discharge Placement PASRR number recieved: 09/02/24            Patient chooses bed at: Other - please specify in the comment section below: [facility]) Patient to be transferred to facility by: [facility] Name of family member notified: Pt will notify family Patient and family notified of of transfer: 09/02/24  Discharge Plan and Services Additional resources added to the After Visit Summary for   In-house Referral: Clinical Social Work Discharge Planning Services: NA Post Acute Care Choice: Skilled Nursing Facility          DME Arranged: N/A DME Agency: NA                  Social Drivers of Health (SDOH) Interventions SDOH Screenings   Food Insecurity: No Food Insecurity (02/14/2024)   Received from [facility]  Housing: Low Risk  (02/14/2024)   Received from [facility]  Transportation Needs: No Transportation Needs (04/11/2024)   Received from [facility]  Utilities: Not At Risk (02/14/2024)   Received from [facility] Resource Strain: Low Risk   (02/14/2024)   Received from [facility]  Physical Activity: Inactive (02/14/2024)   Received from [facility]  Social Connections: Moderately Isolated (02/14/2024)   Received from [facility]  Stress: No Stress Concern Present (02/14/2024)   Received from [facility]  Tobacco Use: Low Risk  (08/29/2024)  Health Literacy: Inadequate Health Literacy (04/11/2024)   Received from [facility]     Readmission Risk Interventions    08/28/2024   11:08 AM  Readmission Risk Prevention Plan  Post Dischage Appt Complete  Medication Screening Complete  Transportation Screening Complete

## 2024-09-02 NOTE — Care Management Important Message (Signed)
 Important Message  Patient Details  Name: James Gillespie MRN: [id number] Date of Birth: 07/11/1951   Important Message Given:  Yes - Medicare IM     Nefertari Rebman L Philamena Kramar 09/02/2024, 10:06 AM

## 2024-09-02 NOTE — Progress Notes (Signed)
 Mobility Specialist Progress Note:    09/02/24 0920  Mobility  Activity Pivoted/transferred to/from [facility]  Level of Assistance Minimal assist, patient does 75% or more  Assistive Device Front wheel walker  Distance Ambulated (ft) 3 ft  Range of Motion/Exercises Active;All extremities  Activity Response Tolerated well  Mobility Referral Yes  Mobility visit 1 Mobility  Mobility Specialist Start Time (ACUTE ONLY) 0920  Mobility Specialist Stop Time (ACUTE ONLY) 0935  Mobility Specialist Time Calculation (min) (ACUTE ONLY) 15 min   Pt received on BSC, requesting assistance back to bed. Required MinA to stand and transfer with RW. Tolerated well, alarm on. All needs met.  Gabbie Marzo Mobility Specialist Please contact via Special Educational Needs Teacher or  Rehab office at [facility]

## 2024-09-03 LAB — AEROBIC/ANAEROBIC CULTURE W GRAM STAIN (SURGICAL/DEEP WOUND)
Culture: NO GROWTH
Gram Stain: NONE SEEN
